# Patient Record
Sex: Female | Born: 1950 | Race: White | Hispanic: No | Marital: Married | Smoking: Never smoker
Health system: Southern US, Community
[De-identification: ages and names within clinical notes are randomized; demographics above are authoritative.]

## PROBLEM LIST (undated history)

## (undated) DIAGNOSIS — G479 Sleep disorder, unspecified: Secondary | ICD-10-CM

## (undated) DIAGNOSIS — E785 Hyperlipidemia, unspecified: Secondary | ICD-10-CM

## (undated) DIAGNOSIS — M1990 Unspecified osteoarthritis, unspecified site: Secondary | ICD-10-CM

## (undated) DIAGNOSIS — K219 Gastro-esophageal reflux disease without esophagitis: Secondary | ICD-10-CM

## (undated) DIAGNOSIS — C801 Malignant (primary) neoplasm, unspecified: Secondary | ICD-10-CM

## (undated) DIAGNOSIS — Z9889 Other specified postprocedural states: Secondary | ICD-10-CM

## (undated) DIAGNOSIS — Z9289 Personal history of other medical treatment: Secondary | ICD-10-CM

## (undated) DIAGNOSIS — R112 Nausea with vomiting, unspecified: Secondary | ICD-10-CM

## (undated) DIAGNOSIS — E039 Hypothyroidism, unspecified: Secondary | ICD-10-CM

## (undated) DIAGNOSIS — I890 Lymphedema, not elsewhere classified: Secondary | ICD-10-CM

## (undated) DIAGNOSIS — Z8489 Family history of other specified conditions: Secondary | ICD-10-CM

## (undated) DIAGNOSIS — L259 Unspecified contact dermatitis, unspecified cause: Secondary | ICD-10-CM

## (undated) DIAGNOSIS — R51 Headache: Secondary | ICD-10-CM

## (undated) DIAGNOSIS — I10 Essential (primary) hypertension: Secondary | ICD-10-CM

## (undated) DIAGNOSIS — R519 Headache, unspecified: Secondary | ICD-10-CM

## (undated) DIAGNOSIS — F419 Anxiety disorder, unspecified: Secondary | ICD-10-CM

## (undated) HISTORY — PX: CATARACT EXTRACTION, BILATERAL: SHX1313

## (undated) HISTORY — PX: EYE SURGERY: SHX253

## (undated) HISTORY — PX: TONSILLECTOMY: SUR1361

## (undated) HISTORY — PX: TUBAL LIGATION: SHX77

## (undated) HISTORY — PX: BILATERAL TOTAL MASTECTOMY WITH AXILLARY LYMPH NODE DISSECTION: SHX6364

## (undated) HISTORY — PX: OTHER SURGICAL HISTORY: SHX169

## (undated) HISTORY — PX: ABDOMINAL HYSTERECTOMY: SHX81

## (undated) HISTORY — PX: APPENDECTOMY: SHX54

---

## 1979-01-10 DIAGNOSIS — Z9289 Personal history of other medical treatment: Secondary | ICD-10-CM

## 1979-01-10 HISTORY — DX: Personal history of other medical treatment: Z92.89

## 2013-10-22 NOTE — H&P (Signed)
TOTAL KNEE ADMISSION H&P  Patient is being admitted for right total knee arthroplasty.  Subjective:  Chief Complaint:    Right knee OA / pain  HPI: Christina Crawford, 63 y.o. female, has a history of pain and functional disability in the right knee due to arthritis and has failed non-surgical conservative treatments for greater than 12 weeks to include NSAID's and/or analgesics, corticosteriod injections and activity modification.  Onset of symptoms was gradual, starting >10 years ago with gradually worsening course since that time. The patient noted prior procedures on the knee to include  arthroscopy on the right knee(s).  Patient currently rates pain in the right knee(s) at 10 out of 10 with activity. Patient has night pain, worsening of pain with activity and weight bearing, pain that interferes with activities of daily living, pain with passive range of motion, crepitus and joint swelling.  Patient has evidence of periarticular osteophytes and joint space narrowing by imaging studies. There is no active infection.  Risks, benefits and expectations were discussed with the patient.  Risks including but not limited to the risk of anesthesia, blood clots, nerve damage, blood vessel damage, failure of the prosthesis, infection and up to and including death.  Patient understand the risks, benefits and expectations and wishes to proceed with surgery.   PCP: No primary provider on file.  D/C Plans:      Home with HHPT  Post-op Meds:       No Rx given  Tranexamic Acid:      To be given - IV    Decadron:      Is to be given  FYI:     ASA post-op  Norco post-op     Past Medical History  Diagnosis Date  . Lymphedema     left arm   . PONV (postoperative nausea and vomiting)   . Family history of anesthesia complication     sister slow to wake up with general anesthesia   . Hypertension   . Hypothyroidism   . GERD (gastroesophageal reflux disease)   . Headache     not often   . Arthritis    . Cancer     left breast cancer - 2011     Past Surgical History  Procedure Laterality Date  . Bilateral total mastectomy with axillary lymph node dissection    . Abdominal hysterectomy    . Appendectomy    . Ganglion tumors removed from hands     . Tubal ligation    . Right knee arthroscopy         Allergies  Allergen Reactions  . Sulfamethoxazole Nausea Only  . Adhesive [Tape] Rash  . Augmentin Es-600 [Amoxicillin-Pot Clavulanate] Rash  . Contrast Media [Iodinated Diagnostic Agents] Rash  . Latex Rash  . Suprax [Cefixime] Rash      History  Substance Use Topics  . Smoking status: Never Smoker   . Smokeless tobacco: Never Used  . Alcohol Use: No       Review of Systems  Constitutional: Positive for malaise/fatigue.  HENT: Negative.   Eyes: Negative.   Respiratory: Negative.   Cardiovascular: Negative.   Gastrointestinal: Positive for heartburn and nausea.  Genitourinary: Negative.   Musculoskeletal: Positive for joint pain.  Skin: Negative.   Neurological: Negative.   Endo/Heme/Allergies: Negative.   Psychiatric/Behavioral: The patient has insomnia.     Objective:  Physical Exam  Constitutional: She is oriented to person, place, and time. She appears well-developed and well-nourished.  HENT:  Head: Normocephalic and atraumatic.  Eyes: Pupils are equal, round, and reactive to light.  Neck: Neck supple. No JVD present. No tracheal deviation present. No thyromegaly present.  Cardiovascular: Normal rate, regular rhythm, normal heart sounds and intact distal pulses.   Respiratory: Effort normal and breath sounds normal. No respiratory distress. She has no wheezes.  GI: Soft. There is no tenderness. There is no guarding.  Musculoskeletal:       Right knee: She exhibits decreased range of motion, swelling, abnormal alignment (valgus) and bony tenderness. She exhibits no ecchymosis, no deformity, no laceration and no erythema. Tenderness found.   Lymphadenopathy:    She has no cervical adenopathy.  Neurological: She is alert and oriented to person, place, and time.  Skin: Skin is warm and dry.  Psychiatric: She has a normal mood and affect.      Imaging Review Plain radiographs demonstrate severe degenerative joint disease of the right knee(s). The overall alignment is mild valgus. The bone quality appears to be good for age and reported activity level.  Assessment/Plan:  End stage arthritis, right knee   The patient history, physical examination, clinical judgment of the provider and imaging studies are consistent with end stage degenerative joint disease of the right knee(s) and total knee arthroplasty is deemed medically necessary. The treatment options including medical management, injection therapy arthroscopy and arthroplasty were discussed at length. The risks and benefits of total knee arthroplasty were presented and reviewed. The risks due to aseptic loosening, infection, stiffness, patella tracking problems, thromboembolic complications and other imponderables were discussed. The patient acknowledged the explanation, agreed to proceed with the plan and consent was signed. Patient is being admitted for inpatient treatment for surgery, pain control, PT, OT, prophylactic antibiotics, VTE prophylaxis, progressive ambulation and ADL's and discharge planning. The patient is planning to be discharged home with home health services.      West Pugh Adolf [place]   PA-C  10/22/2013, 3:07 PM

## 2013-10-23 ENCOUNTER — Encounter (HOSPITAL_COMMUNITY): Payer: Self-pay | Admitting: Pharmacy Technician

## 2013-10-29 NOTE — Patient Instructions (Addendum)
KAYDANCE BOWIE  10/29/2013   Your procedure is scheduled on:  11/11/2013    Come thru the Emergency Room entrance and   Follow the Signs to [facility] at  [place]      am  Call this number if you have problems the morning of surgery: [phone]   Remember:   Do not eat food or drink liquids after midnight.   Take these medicines the morning of surgery with A SIP OF WATER: hydrocodone if needed, synthroid, prilosec    Do not wear jewelry, make-up or nail polish.  Do not wear lotions, powders, or perfumes. deodorant.  Do not shave 48 hours prior to surgery.   Do not bring valuables to the hospital.  Contacts, dentures or bridgework may not be worn into surgery.  Leave suitcase in the car. After surgery it may be brought to your room.  For patients admitted to the hospital, checkout time is 11:00 AM the day of  discharge.        Please read over the following fact sheets that you were given: MRSA Information, coughing and deep breathing exercises, leg exercises            [facility] - Preparing for Surgery Before surgery, you can play an important role.  Because skin is not sterile, your skin needs to be as free of germs as possible.  You can reduce the number of germs on your skin by washing with CHG (chlorahexidine gluconate) soap before surgery.  CHG is an antiseptic cleaner which kills germs and bonds with the skin to continue killing germs even after washing. Please DO NOT use if you have an allergy to CHG or antibacterial soaps.  If your skin becomes reddened/irritated stop using the CHG and inform your nurse when you arrive at Short Stay. Do not shave (including legs and underarms) for at least 48 hours prior to the first CHG shower.  You may shave your face/neck. Please follow these instructions carefully:  1.  Shower with CHG Soap the night before surgery and the  morning of Surgery.  2.  If you choose to wash your hair, wash your hair first as usual with your  normal   shampoo.  3.  After you shampoo, rinse your hair and body thoroughly to remove the  shampoo.                           4.  Use CHG as you would any other liquid soap.  You can apply chg directly  to the skin and wash                       Gently with a scrungie or clean washcloth.  5.  Apply the CHG Soap to your body ONLY FROM THE NECK DOWN.   Do not use on face/ open                           Wound or open sores. Avoid contact with eyes, ears mouth and genitals (private parts).                       Wash face,  Genitals (private parts) with your normal soap.             6.  Wash thoroughly, paying special attention to the area where your surgery  will be  performed.  7.  Thoroughly rinse your body with warm water from the neck down.  8.  DO NOT shower/wash with your normal soap after using and rinsing off  the CHG Soap.                9.  Pat yourself dry with a clean towel.            10.  Wear clean pajamas.            11.  Place clean sheets on your bed the night of your first shower and do not  sleep with pets. Day of Surgery : Do not apply any lotions/deodorants the morning of surgery.  Please wear clean clothes to the hospital/surgery center.  FAILURE TO FOLLOW THESE INSTRUCTIONS MAY RESULT IN THE CANCELLATION OF YOUR SURGERY PATIENT SIGNATURE_________________________________  NURSE SIGNATURE__________________________________  ________________________________________________________________________  WHAT IS A BLOOD TRANSFUSION? Blood Transfusion Information  A transfusion is the replacement of blood or some of its parts. Blood is made up of multiple cells which provide different functions.  Red blood cells carry oxygen and are used for blood loss replacement.  White blood cells fight against infection.  Platelets control bleeding.  Plasma helps clot blood.  Other blood products are available for specialized needs, such as hemophilia or other clotting disorders. BEFORE THE  TRANSFUSION  Who gives blood for transfusions?   Healthy volunteers who are fully evaluated to make sure their blood is safe. This is blood bank blood. Transfusion therapy is the safest it has ever been in the practice of medicine. Before blood is taken from a donor, a complete history is taken to make sure that person has no history of diseases nor engages in risky social behavior (examples are intravenous drug use or sexual activity with multiple partners). The donor's travel history is screened to minimize risk of transmitting infections, such as malaria. The donated blood is tested for signs of infectious diseases, such as HIV and hepatitis. The blood is then tested to be sure it is compatible with you in order to minimize the chance of a transfusion reaction. If you or a relative donates blood, this is often done in anticipation of surgery and is not appropriate for emergency situations. It takes many days to process the donated blood. RISKS AND COMPLICATIONS Although transfusion therapy is very safe and saves many lives, the main dangers of transfusion include:   Getting an infectious disease.  Developing a transfusion reaction. This is an allergic reaction to something in the blood you were given. Every precaution is taken to prevent this. The decision to have a blood transfusion has been considered carefully by your caregiver before blood is given. Blood is not given unless the benefits outweigh the risks. AFTER THE TRANSFUSION  Right after receiving a blood transfusion, you will usually feel much better and more energetic. This is especially true if your red blood cells have gotten low (anemic). The transfusion raises the level of the red blood cells which carry oxygen, and this usually causes an energy increase.  The nurse administering the transfusion will monitor you carefully for complications. HOME CARE INSTRUCTIONS  No special instructions are needed after a transfusion. You may find  your energy is better. Speak with your caregiver about any limitations on activity for underlying diseases you may have. SEEK MEDICAL CARE IF:   Your condition is not improving after your transfusion.  You develop redness or irritation at the intravenous (IV) site. [place]  IF:  Any of the following symptoms occur over the next 12 hours:  Shaking chills.  You have a temperature by mouth above 102 F (38.9 C), not controlled by medicine.  Chest, back, or muscle pain.  People around you feel you are not acting correctly or are confused.  Shortness of breath or difficulty breathing.  Dizziness and fainting.  You get a rash or develop hives.  You have a decrease in urine output.  Your urine turns a dark color or changes to pink, red, or brown. Any of the following symptoms occur over the next 10 days:  You have a temperature by mouth above 102 F (38.9 C), not controlled by medicine.  Shortness of breath.  Weakness after normal activity.  The white part of the eye turns yellow (jaundice).  You have a decrease in the amount of urine or are urinating less often.  Your urine turns a dark color or changes to pink, red, or brown. Document Released: 12/24/1999 Document Revised: 03/20/2011 Document Reviewed: 08/12/2007 Exit[employer] Patient Information 2014 [place].  _______________________________________________________________________  Incentive Spirometer  An incentive spirometer is a tool that can help keep your lungs clear and active. This tool measures how well you are filling your lungs with each breath. Taking long deep breaths may help reverse or decrease the chance of developing breathing (pulmonary) problems (especially infection) following:  A long period of time when you are unable to move or be active. BEFORE THE PROCEDURE   If the spirometer includes an indicator to show your best effort, your nurse or respiratory therapist will set it  to a desired goal.  If possible, sit up straight or lean slightly forward. Try not to slouch.  Hold the incentive spirometer in an upright position. INSTRUCTIONS FOR USE  1. Sit on the edge of your bed if possible, or sit up as far as you can in bed or on a chair. 2. Hold the incentive spirometer in an upright position. 3. Breathe out normally. 4. Place the mouthpiece in your mouth and seal your lips tightly around it. 5. Breathe in slowly and as deeply as possible, raising the piston or the ball toward the top of the column. 6. Hold your breath for 3-5 seconds or for as long as possible. Allow the piston or ball to fall to the bottom of the column. 7. Remove the mouthpiece from your mouth and breathe out normally. 8. Rest for a few seconds and repeat Steps 1 through 7 at least 10 times every 1-2 hours when you are awake. Take your time and take a few normal breaths between deep breaths. 9. The spirometer may include an indicator to show your best effort. Use the indicator as a goal to work toward during each repetition. 10. After each set of 10 deep breaths, practice coughing to be sure your lungs are clear. If you have an incision (the cut made at the time of surgery), support your incision when coughing by placing a pillow or rolled up towels firmly against it. Once you are able to get out of bed, walk around indoors and cough well. You may stop using the incentive spirometer when instructed by your caregiver.  RISKS AND COMPLICATIONS  Take your time so you do not get dizzy or light-headed.  If you are in pain, you may need to take or ask for pain medication before doing incentive spirometry. It is harder to take a deep breath if you are having pain. AFTER USE  Rest  and breathe slowly and easily.  It can be helpful to keep track of a log of your progress. Your caregiver can provide you with a simple table to help with this. If you are using the spirometer at home, follow these  instructions: [place] IF:   You are having difficultly using the spirometer.  You have trouble using the spirometer as often as instructed.  Your pain medication is not giving enough relief while using the spirometer.  You develop fever of 100.5 F (38.1 C) or higher. SEEK IMMEDIATE MEDICAL CARE IF:   You cough up bloody sputum that had not been present before.  You develop fever of 102 F (38.9 C) or greater.  You develop worsening pain at or near the incision site. MAKE SURE YOU:   Understand these instructions.  Will watch your condition.  Will get help right away if you are not doing well or get worse. Document Released: 05/08/2006 Document Revised: 03/20/2011 Document Reviewed: 07/09/2006 [facility] Patient Information 2014 [place], [place].   ________________________________________________________________________

## 2013-10-30 ENCOUNTER — Encounter (HOSPITAL_COMMUNITY): Payer: Self-pay

## 2013-10-30 ENCOUNTER — Encounter (HOSPITAL_COMMUNITY)
Admission: RE | Admit: 2013-10-30 | Discharge: 2013-10-30 | Disposition: A | Discharge status: Home / Self Care | Payer: Medicare Other | Source: Ambulatory Visit | Attending: Orthopedic Surgery | Admitting: Orthopedic Surgery

## 2013-10-30 ENCOUNTER — Ambulatory Visit (HOSPITAL_COMMUNITY)
Admission: RE | Admit: 2013-10-30 | Discharge: 2013-10-30 | Disposition: A | Discharge status: Home / Self Care | Payer: Medicare Other | Source: Ambulatory Visit | Attending: Orthopedic Surgery | Admitting: Orthopedic Surgery

## 2013-10-30 DIAGNOSIS — I10 Essential (primary) hypertension: Secondary | ICD-10-CM

## 2013-10-30 DIAGNOSIS — Z853 Personal history of malignant neoplasm of breast: Secondary | ICD-10-CM | POA: Insufficient documentation

## 2013-10-30 DIAGNOSIS — Z01810 Encounter for preprocedural cardiovascular examination: Secondary | ICD-10-CM | POA: Diagnosis present

## 2013-10-30 HISTORY — DX: Family history of other specified conditions: Z84.89

## 2013-10-30 HISTORY — DX: Malignant (primary) neoplasm, unspecified: C80.1

## 2013-10-30 HISTORY — DX: Headache, unspecified: R51.9

## 2013-10-30 HISTORY — DX: Unspecified osteoarthritis, unspecified site: M19.90

## 2013-10-30 HISTORY — DX: Other specified postprocedural states: R11.2

## 2013-10-30 HISTORY — DX: Lymphedema, not elsewhere classified: I89.0

## 2013-10-30 HISTORY — DX: Headache: R51

## 2013-10-30 HISTORY — DX: Gastro-esophageal reflux disease without esophagitis: K21.9

## 2013-10-30 HISTORY — DX: Hypothyroidism, unspecified: E03.9

## 2013-10-30 HISTORY — DX: Essential (primary) hypertension: I10

## 2013-10-30 HISTORY — DX: Other specified postprocedural states: Z98.890

## 2013-10-30 LAB — APTT: APTT: 28 s (ref 24–37)

## 2013-10-30 LAB — URINALYSIS, ROUTINE W REFLEX MICROSCOPIC
Bilirubin Urine: NEGATIVE
Glucose, UA: NEGATIVE mg/dL
HGB URINE DIPSTICK: NEGATIVE
Ketones, ur: NEGATIVE mg/dL
Leukocytes, UA: NEGATIVE
Nitrite: NEGATIVE
PH: 6 (ref 5.0–8.0)
PROTEIN: NEGATIVE mg/dL
Specific Gravity, Urine: 1.012 (ref 1.005–1.030)
Urobilinogen, UA: 0.2 mg/dL (ref 0.0–1.0)

## 2013-10-30 LAB — PROTIME-INR
INR: 0.99 (ref 0.00–1.49)
Prothrombin Time: 13.2 seconds (ref 11.6–15.2)

## 2013-10-30 LAB — SURGICAL PCR SCREEN
MRSA, PCR: INVALID — AB
Staphylococcus aureus: INVALID — AB

## 2013-10-30 NOTE — Progress Notes (Signed)
Clearance - Dr Redmond Pulling- 10/16/2013 on chart LOV with Dr Redmond Pulling 10/20/2013 on chart  EKG- 10/16/2013 on chart  Labs on chart of CBC/DIFF/CMP on chart done 10/20/2013.

## 2013-10-30 NOTE — Progress Notes (Signed)
At preop appoinrtment for upcoming surgery on 11/11/2013 patient scored a 4 on STOP BANG Assessment Tool for Obstructive Sleep Apnea.  This patient is at elevated risk for Obstructive Sleep Apnea.

## 2013-11-02 LAB — MRSA CULTURE

## 2013-11-10 NOTE — Anesthesia Preprocedure Evaluation (Addendum)
Anesthesia Evaluation  Patient identified by MRN, date of birth, ID band Patient awake    Reviewed: Allergy & Precautions, H&P , NPO status , Patient's Chart, lab work & pertinent test results  History of Anesthesia Complications (+) PONV, Family history of anesthesia reaction and history of anesthetic complications (sister slow to emerge from anesthesia)  Airway Mallampati: III  TM Distance: >3 FB Neck ROM: Full    Dental no notable dental hx. (+) Teeth Intact, Dental Advisory Given   Pulmonary neg pulmonary ROS,  breath sounds clear to auscultation  Pulmonary exam normal       Cardiovascular Exercise Tolerance: Good hypertension, Pt. on medications and Pt. on home beta blockers Rhythm:Regular Rate:Normal     Neuro/Psych  Headaches, negative psych ROS   GI/Hepatic Neg liver ROS, GERD-  Medicated and Controlled,  Endo/Other  Hypothyroidism   Renal/GU negative Renal ROS  negative genitourinary   Musculoskeletal  (+) Arthritis -,   Abdominal   Peds negative pediatric ROS (+)  Hematology negative hematology ROS (+)   Anesthesia Other Findings Hx of breast ca- L  Reproductive/Obstetrics negative OB ROS                            Anesthesia Physical Anesthesia Plan  ASA: II  Anesthesia Plan: Spinal   Post-op Pain Management:    Induction: Intravenous  Airway Management Planned: Nasal Cannula  Additional Equipment:   Intra-op Plan:   Post-operative Plan: Extubation in OR  Informed Consent: I have reviewed the patients History and Physical, chart, labs and discussed the procedure including the risks, benefits and alternatives for the proposed anesthesia with the patient or authorized representative who has indicated his/her understanding and acceptance.   Dental advisory given  Plan Discussed with: CRNA  Anesthesia Plan Comments:         Anesthesia Quick Evaluation

## 2013-11-11 ENCOUNTER — Encounter (HOSPITAL_COMMUNITY): Payer: Self-pay | Admitting: *Deleted

## 2013-11-11 ENCOUNTER — Inpatient Hospital Stay (HOSPITAL_COMMUNITY)
Admission: RE | Admit: 2013-11-11 | Discharge: 2013-11-13 | DRG: 470 | Disposition: A | Discharge status: Home Health | Payer: Medicare Other | Source: Ambulatory Visit | Attending: Orthopedic Surgery | Admitting: Orthopedic Surgery

## 2013-11-11 ENCOUNTER — Encounter (HOSPITAL_COMMUNITY)
Admission: RE | Disposition: A | Discharge status: Home Health | Payer: Self-pay | Source: Ambulatory Visit | Attending: Orthopedic Surgery

## 2013-11-11 ENCOUNTER — Inpatient Hospital Stay (HOSPITAL_COMMUNITY): Payer: Medicare Other | Admitting: Anesthesiology

## 2013-11-11 DIAGNOSIS — M659 Synovitis and tenosynovitis, unspecified: Secondary | ICD-10-CM | POA: Diagnosis present

## 2013-11-11 DIAGNOSIS — Z853 Personal history of malignant neoplasm of breast: Secondary | ICD-10-CM

## 2013-11-11 DIAGNOSIS — M179 Osteoarthritis of knee, unspecified: Secondary | ICD-10-CM | POA: Diagnosis present

## 2013-11-11 DIAGNOSIS — E039 Hypothyroidism, unspecified: Secondary | ICD-10-CM | POA: Diagnosis present

## 2013-11-11 DIAGNOSIS — I10 Essential (primary) hypertension: Secondary | ICD-10-CM | POA: Diagnosis present

## 2013-11-11 DIAGNOSIS — Z6836 Body mass index (BMI) 36.0-36.9, adult: Secondary | ICD-10-CM | POA: Diagnosis not present

## 2013-11-11 DIAGNOSIS — E669 Obesity, unspecified: Secondary | ICD-10-CM | POA: Diagnosis present

## 2013-11-11 DIAGNOSIS — Z96659 Presence of unspecified artificial knee joint: Secondary | ICD-10-CM

## 2013-11-11 DIAGNOSIS — M25561 Pain in right knee: Secondary | ICD-10-CM | POA: Diagnosis present

## 2013-11-11 DIAGNOSIS — K219 Gastro-esophageal reflux disease without esophagitis: Secondary | ICD-10-CM | POA: Diagnosis present

## 2013-11-11 DIAGNOSIS — Z96651 Presence of right artificial knee joint: Secondary | ICD-10-CM

## 2013-11-11 HISTORY — PX: TOTAL KNEE ARTHROPLASTY: SHX125

## 2013-11-11 LAB — ABO/RH: ABO/RH(D): A POS

## 2013-11-11 LAB — TYPE AND SCREEN
ABO/RH(D): A POS
ANTIBODY SCREEN: NEGATIVE

## 2013-11-11 SURGERY — ARTHROPLASTY, KNEE, TOTAL
Anesthesia: Spinal | Site: Knee | Laterality: Right

## 2013-11-11 MED ORDER — PROPOFOL INFUSION 10 MG/ML OPTIME
INTRAVENOUS | Status: DC | PRN
  Administered 2013-11-11: 50 ug/kg/min via INTRAVENOUS

## 2013-11-11 MED ORDER — CEFAZOLIN SODIUM-DEXTROSE 2-3 GM-% IV SOLR: 2.0000 g | INTRAVENOUS | Status: DC

## 2013-11-11 MED ORDER — ROSUVASTATIN CALCIUM 10 MG PO TABS
10.0000 mg | ORAL_TABLET | Freq: Every day | ORAL | Status: DC
  Administered 2013-11-11 – 2013-11-12 (×2): 10 mg via ORAL
  Filled 2013-11-11 (×3): qty 1

## 2013-11-11 MED ORDER — MENTHOL 3 MG MT LOZG: 1.0000 | LOZENGE | OROMUCOSAL | Status: DC | PRN

## 2013-11-11 MED ORDER — FOLIC ACID 1 MG PO TABS
1.0000 mg | ORAL_TABLET | Freq: Every day | ORAL | Status: DC
  Administered 2013-11-11 – 2013-11-12 (×2): 1 mg via ORAL
  Filled 2013-11-11 (×3): qty 1

## 2013-11-11 MED ORDER — ONDANSETRON HCL 4 MG/2ML IJ SOLN: 4.0000 mg | Freq: Once | INTRAMUSCULAR | Status: DC | PRN

## 2013-11-11 MED ORDER — HYDROCHLOROTHIAZIDE 25 MG PO TABS
25.0000 mg | ORAL_TABLET | Freq: Every day | ORAL | Status: DC
  Administered 2013-11-11 – 2013-11-12 (×2): 25 mg via ORAL
  Filled 2013-11-11 (×3): qty 1

## 2013-11-11 MED ORDER — ONDANSETRON HCL 4 MG PO TABS
4.0000 mg | ORAL_TABLET | Freq: Four times a day (QID) | ORAL | Status: DC | PRN
  Administered 2013-11-13: 4 mg via ORAL
  Filled 2013-11-11: qty 1

## 2013-11-11 MED ORDER — FENTANYL CITRATE 0.05 MG/ML IJ SOLN
INTRAMUSCULAR | Status: AC
  Filled 2013-11-11: qty 2

## 2013-11-11 MED ORDER — BUPIVACAINE-EPINEPHRINE (PF) 0.25% -1:200000 IJ SOLN
INTRAMUSCULAR | Status: DC | PRN
  Administered 2013-11-11: 30 mL

## 2013-11-11 MED ORDER — LETROZOLE 2.5 MG PO TABS
2.5000 mg | ORAL_TABLET | Freq: Every day | ORAL | Status: DC
  Administered 2013-11-11 – 2013-11-12 (×2): 2.5 mg via ORAL
  Filled 2013-11-11 (×3): qty 1

## 2013-11-11 MED ORDER — SODIUM CHLORIDE 0.9 % IJ SOLN
INTRAMUSCULAR | Status: AC
  Filled 2013-11-11: qty 50

## 2013-11-11 MED ORDER — TRANEXAMIC ACID 100 MG/ML IV SOLN
1000.0000 mg | Freq: Once | INTRAVENOUS | Status: AC
  Administered 2013-11-11: 1000 mg via INTRAVENOUS
  Filled 2013-11-11: qty 10

## 2013-11-11 MED ORDER — CEFAZOLIN SODIUM-DEXTROSE 2-3 GM-% IV SOLR
INTRAVENOUS | Status: AC
  Filled 2013-11-11: qty 50

## 2013-11-11 MED ORDER — METHOCARBAMOL 1000 MG/10ML IJ SOLN
500.0000 mg | Freq: Four times a day (QID) | INTRAVENOUS | Status: DC | PRN
  Administered 2013-11-11 (×2): 500 mg via INTRAVENOUS
  Filled 2013-11-11 (×3): qty 5

## 2013-11-11 MED ORDER — LACTATED RINGERS IV SOLN: INTRAVENOUS | Status: DC

## 2013-11-11 MED ORDER — FENTANYL CITRATE 0.05 MG/ML IJ SOLN
INTRAMUSCULAR | Status: DC | PRN
  Administered 2013-11-11 (×4): 25 ug via INTRAVENOUS

## 2013-11-11 MED ORDER — ONDANSETRON HCL 4 MG/2ML IJ SOLN
INTRAMUSCULAR | Status: AC
  Filled 2013-11-11: qty 2

## 2013-11-11 MED ORDER — HYDROCODONE-ACETAMINOPHEN 7.5-325 MG PO TABS
1.0000 | ORAL_TABLET | ORAL | Status: DC
  Administered 2013-11-11: 1 via ORAL
  Administered 2013-11-11: 2 via ORAL
  Administered 2013-11-11: 1 via ORAL
  Administered 2013-11-11 – 2013-11-12 (×3): 2 via ORAL
  Administered 2013-11-12: 1 via ORAL
  Administered 2013-11-12 – 2013-11-13 (×5): 2 via ORAL
  Filled 2013-11-11 (×11): qty 2

## 2013-11-11 MED ORDER — FERROUS SULFATE 325 (65 FE) MG PO TABS
325.0000 mg | ORAL_TABLET | Freq: Three times a day (TID) | ORAL | Status: DC
  Administered 2013-11-11 – 2013-11-13 (×6): 325 mg via ORAL
  Filled 2013-11-11 (×9): qty 1

## 2013-11-11 MED ORDER — HYDROMORPHONE HCL 1 MG/ML IJ SOLN
0.5000 mg | INTRAMUSCULAR | Status: DC | PRN
  Administered 2013-11-11 – 2013-11-12 (×4): 1 mg via INTRAVENOUS
  Filled 2013-11-11 (×4): qty 1

## 2013-11-11 MED ORDER — PROPOFOL 10 MG/ML IV BOLUS
INTRAVENOUS | Status: DC | PRN
  Administered 2013-11-11: 20 mg via INTRAVENOUS

## 2013-11-11 MED ORDER — CHLORHEXIDINE GLUCONATE 4 % EX LIQD: 60.0000 mL | Freq: Once | CUTANEOUS | Status: DC

## 2013-11-11 MED ORDER — SODIUM CHLORIDE 0.9 % IJ SOLN
INTRAMUSCULAR | Status: DC | PRN
  Administered 2013-11-11: 30 mL

## 2013-11-11 MED ORDER — BUPIVACAINE IN DEXTROSE 0.75-8.25 % IT SOLN
INTRATHECAL | Status: DC | PRN
  Administered 2013-11-11: 1.8 mL via INTRATHECAL

## 2013-11-11 MED ORDER — ASPIRIN EC 325 MG PO TBEC
325.0000 mg | DELAYED_RELEASE_TABLET | Freq: Two times a day (BID) | ORAL | Status: DC
  Administered 2013-11-12 – 2013-11-13 (×3): 325 mg via ORAL
  Filled 2013-11-11 (×5): qty 1

## 2013-11-11 MED ORDER — MIDAZOLAM HCL 2 MG/2ML IJ SOLN
INTRAMUSCULAR | Status: AC
  Filled 2013-11-11: qty 2

## 2013-11-11 MED ORDER — LEVOTHYROXINE SODIUM 50 MCG PO TABS
50.0000 ug | ORAL_TABLET | Freq: Every day | ORAL | Status: DC
  Administered 2013-11-12 – 2013-11-13 (×2): 50 ug via ORAL
  Filled 2013-11-11 (×3): qty 1

## 2013-11-11 MED ORDER — PROPOFOL 10 MG/ML IV BOLUS
INTRAVENOUS | Status: AC
  Filled 2013-11-11: qty 20

## 2013-11-11 MED ORDER — ALUM & MAG HYDROXIDE-SIMETH 200-200-20 MG/5ML PO SUSP: 30.0000 mL | ORAL | Status: DC | PRN

## 2013-11-11 MED ORDER — CLINDAMYCIN PHOSPHATE 900 MG/50ML IV SOLN
INTRAVENOUS | Status: DC | PRN
  Administered 2013-11-11: 900 mg via INTRAVENOUS

## 2013-11-11 MED ORDER — DEXAMETHASONE SODIUM PHOSPHATE 10 MG/ML IJ SOLN
10.0000 mg | Freq: Once | INTRAMUSCULAR | Status: AC
  Administered 2013-11-11: 10 mg via INTRAVENOUS

## 2013-11-11 MED ORDER — KETOROLAC TROMETHAMINE 30 MG/ML IJ SOLN
INTRAMUSCULAR | Status: DC | PRN
  Administered 2013-11-11: 30 mg

## 2013-11-11 MED ORDER — METOPROLOL SUCCINATE ER 100 MG PO TB24
100.0000 mg | ORAL_TABLET | Freq: Every day | ORAL | Status: DC
  Administered 2013-11-11 – 2013-11-12 (×2): 100 mg via ORAL
  Filled 2013-11-11 (×3): qty 1

## 2013-11-11 MED ORDER — POLYETHYLENE GLYCOL 3350 17 G PO PACK
17.0000 g | PACK | Freq: Two times a day (BID) | ORAL | Status: DC
  Administered 2013-11-11 – 2013-11-12 (×3): 17 g via ORAL

## 2013-11-11 MED ORDER — METHOCARBAMOL 500 MG PO TABS
500.0000 mg | ORAL_TABLET | Freq: Four times a day (QID) | ORAL | Status: DC | PRN
  Administered 2013-11-12 – 2013-11-13 (×2): 500 mg via ORAL
  Filled 2013-11-11 (×2): qty 1

## 2013-11-11 MED ORDER — CLINDAMYCIN PHOSPHATE 600 MG/50ML IV SOLN
600.0000 mg | Freq: Four times a day (QID) | INTRAVENOUS | Status: AC
  Administered 2013-11-11 (×2): 600 mg via INTRAVENOUS
  Filled 2013-11-11 (×2): qty 50

## 2013-11-11 MED ORDER — FENTANYL CITRATE 0.05 MG/ML IJ SOLN
25.0000 ug | INTRAMUSCULAR | Status: DC | PRN
  Administered 2013-11-11 (×2): 25 ug via INTRAVENOUS

## 2013-11-11 MED ORDER — EPHEDRINE SULFATE 50 MG/ML IJ SOLN
INTRAMUSCULAR | Status: AC
  Filled 2013-11-11: qty 1

## 2013-11-11 MED ORDER — SODIUM CHLORIDE 0.9 % IV SOLN
INTRAVENOUS | Status: DC
  Administered 2013-11-11 – 2013-11-12 (×2): via INTRAVENOUS
  Filled 2013-11-11 (×7): qty 1000

## 2013-11-11 MED ORDER — BISACODYL 10 MG RE SUPP: 10.0000 mg | Freq: Every day | RECTAL | Status: DC | PRN

## 2013-11-11 MED ORDER — BUPIVACAINE-EPINEPHRINE (PF) 0.25% -1:200000 IJ SOLN
INTRAMUSCULAR | Status: AC
  Filled 2013-11-11: qty 30

## 2013-11-11 MED ORDER — DEXAMETHASONE SODIUM PHOSPHATE 10 MG/ML IJ SOLN
INTRAMUSCULAR | Status: AC
  Filled 2013-11-11: qty 1

## 2013-11-11 MED ORDER — PANTOPRAZOLE SODIUM 40 MG PO TBEC
40.0000 mg | DELAYED_RELEASE_TABLET | Freq: Every day | ORAL | Status: DC
Start: 2013-11-12 — End: 2013-11-13
  Administered 2013-11-12 – 2013-11-13 (×2): 40 mg via ORAL
  Filled 2013-11-11 (×3): qty 1

## 2013-11-11 MED ORDER — LORATADINE 10 MG PO TABS
10.0000 mg | ORAL_TABLET | Freq: Every day | ORAL | Status: DC
  Administered 2013-11-11 – 2013-11-13 (×3): 10 mg via ORAL
  Filled 2013-11-11 (×3): qty 1

## 2013-11-11 MED ORDER — DEXAMETHASONE SODIUM PHOSPHATE 10 MG/ML IJ SOLN
10.0000 mg | Freq: Once | INTRAMUSCULAR | Status: AC
  Administered 2013-11-12: 10 mg via INTRAVENOUS
  Filled 2013-11-11: qty 1

## 2013-11-11 MED ORDER — 0.9 % SODIUM CHLORIDE (POUR BTL) OPTIME
TOPICAL | Status: DC | PRN
  Administered 2013-11-11: 1000 mL

## 2013-11-11 MED ORDER — MAGNESIUM CITRATE PO SOLN: 1.0000 | Freq: Once | ORAL | Status: AC | PRN

## 2013-11-11 MED ORDER — METOCLOPRAMIDE HCL 5 MG/ML IJ SOLN
5.0000 mg | Freq: Three times a day (TID) | INTRAMUSCULAR | Status: DC | PRN
  Administered 2013-11-12: 10 mg via INTRAVENOUS
  Filled 2013-11-11: qty 2

## 2013-11-11 MED ORDER — PHENOL 1.4 % MT LIQD: 1.0000 | OROMUCOSAL | Status: DC | PRN

## 2013-11-11 MED ORDER — LACTATED RINGERS IV SOLN
INTRAVENOUS | Status: DC | PRN
  Administered 2013-11-11 (×3): via INTRAVENOUS

## 2013-11-11 MED ORDER — MIDAZOLAM HCL 5 MG/5ML IJ SOLN
INTRAMUSCULAR | Status: DC | PRN
  Administered 2013-11-11: 2 mg via INTRAVENOUS

## 2013-11-11 MED ORDER — SUMATRIPTAN SUCCINATE 50 MG PO TABS
50.0000 mg | ORAL_TABLET | ORAL | Status: DC | PRN
  Filled 2013-11-11: qty 1

## 2013-11-11 MED ORDER — SODIUM CHLORIDE 0.9 % IJ SOLN
INTRAMUSCULAR | Status: AC
  Filled 2013-11-11: qty 10

## 2013-11-11 MED ORDER — DOCUSATE SODIUM 100 MG PO CAPS
100.0000 mg | ORAL_CAPSULE | Freq: Two times a day (BID) | ORAL | Status: DC
  Administered 2013-11-11 – 2013-11-13 (×5): 100 mg via ORAL

## 2013-11-11 MED ORDER — DIPHENHYDRAMINE HCL 25 MG PO CAPS: 25.0000 mg | ORAL_CAPSULE | Freq: Four times a day (QID) | ORAL | Status: DC | PRN

## 2013-11-11 MED ORDER — SODIUM CHLORIDE 0.9 % IR SOLN
Status: DC | PRN
  Administered 2013-11-11: 1000 mL

## 2013-11-11 MED ORDER — ONDANSETRON HCL 4 MG/2ML IJ SOLN
4.0000 mg | Freq: Four times a day (QID) | INTRAMUSCULAR | Status: DC | PRN
  Administered 2013-11-12 (×2): 4 mg via INTRAVENOUS
  Filled 2013-11-11 (×2): qty 2

## 2013-11-11 MED ORDER — CLINDAMYCIN PHOSPHATE 900 MG/50ML IV SOLN
INTRAVENOUS | Status: AC
  Filled 2013-11-11: qty 50

## 2013-11-11 MED ORDER — ATROPINE SULFATE 0.4 MG/ML IJ SOLN
INTRAMUSCULAR | Status: AC
  Filled 2013-11-11: qty 1

## 2013-11-11 MED ORDER — METOCLOPRAMIDE HCL 10 MG PO TABS: 5.0000 mg | ORAL_TABLET | Freq: Three times a day (TID) | ORAL | Status: DC | PRN

## 2013-11-11 MED ORDER — ONDANSETRON HCL 4 MG/2ML IJ SOLN
INTRAMUSCULAR | Status: DC | PRN
  Administered 2013-11-11: 4 mg via INTRAVENOUS

## 2013-11-11 MED ORDER — KETOROLAC TROMETHAMINE 30 MG/ML IJ SOLN
INTRAMUSCULAR | Status: AC
  Filled 2013-11-11: qty 1

## 2013-11-11 SURGICAL SUPPLY — 54 items
BAG ZIPLOCK 12X15 (MISCELLANEOUS) IMPLANT
BANDAGE ELASTIC 6 VELCRO ST LF (GAUZE/BANDAGES/DRESSINGS) ×3 IMPLANT
BANDAGE ESMARK 6X9 LF (GAUZE/BANDAGES/DRESSINGS) ×1 IMPLANT
BLADE SAW SGTL 13.0X1.19X90.0M (BLADE) ×3 IMPLANT
BNDG ESMARK 6X9 LF (GAUZE/BANDAGES/DRESSINGS) ×3
BOWL SMART MIX CTS (DISPOSABLE) ×3 IMPLANT
CAPT RP KNEE ×3 IMPLANT
CATH FOLEY LATEX FREE 16FR (CATHETERS) ×3 IMPLANT
CEMENT HV SMART SET (Cement) ×6 IMPLANT
CHLORAPREP W/TINT 26ML (MISCELLANEOUS) ×6 IMPLANT
CUFF TOURN SGL QUICK 34 (TOURNIQUET CUFF) ×2
CUFF TRNQT CYL 34X4X40X1 (TOURNIQUET CUFF) ×1 IMPLANT
DECANTER SPIKE VIAL GLASS SM (MISCELLANEOUS) ×3 IMPLANT
DRAPE EXTREMITY TIBURON (DRAPES) ×3 IMPLANT
DRAPE POUCH INSTRU U-SHP 10X18 (DRAPES) ×3 IMPLANT
DRAPE U-SHAPE 47X51 STRL (DRAPES) ×3 IMPLANT
DRSG AQUACEL AG ADV 3.5X10 (GAUZE/BANDAGES/DRESSINGS) ×3 IMPLANT
ELECT REM PT RETURN 9FT ADLT (ELECTROSURGICAL) ×3
ELECTRODE REM PT RTRN 9FT ADLT (ELECTROSURGICAL) ×1 IMPLANT
FACESHIELD WRAPAROUND (MASK) ×12 IMPLANT
GLOVE BIOGEL PI IND STRL 6.5 (GLOVE) ×2 IMPLANT
GLOVE BIOGEL PI IND STRL 7.5 (GLOVE) ×1 IMPLANT
GLOVE BIOGEL PI IND STRL 8.5 (GLOVE) ×1 IMPLANT
GLOVE BIOGEL PI INDICATOR 6.5 (GLOVE) ×4
GLOVE BIOGEL PI INDICATOR 7.5 (GLOVE) ×2
GLOVE BIOGEL PI INDICATOR 8.5 (GLOVE) ×2
GLOVE SURG SS PI 7.0 STRL IVOR (GLOVE) ×6 IMPLANT
GLOVE SURG SS PI 7.5 STRL IVOR (GLOVE) ×6 IMPLANT
GLOVE SURG SS PI 8.5 STRL IVOR (GLOVE) ×4
GLOVE SURG SS PI 8.5 STRL STRW (GLOVE) ×2 IMPLANT
GOWN SPEC L3 XXLG W/TWL (GOWN DISPOSABLE) ×3 IMPLANT
GOWN STRL REUS W/TWL LRG LVL3 (GOWN DISPOSABLE) ×3 IMPLANT
GOWN STRL REUS W/TWL XL LVL3 (GOWN DISPOSABLE) ×6 IMPLANT
HANDPIECE INTERPULSE COAX TIP (DISPOSABLE) ×2
KIT BASIN OR (CUSTOM PROCEDURE TRAY) ×3 IMPLANT
LIQUID BAND (GAUZE/BANDAGES/DRESSINGS) ×3 IMPLANT
MANIFOLD NEPTUNE II (INSTRUMENTS) ×3 IMPLANT
NDL SAFETY ECLIPSE 18X1.5 (NEEDLE) ×1 IMPLANT
NEEDLE HYPO 18GX1.5 SHARP (NEEDLE) ×2
PACK TOTAL JOINT (CUSTOM PROCEDURE TRAY) ×3 IMPLANT
POSITIONER SURGICAL ARM (MISCELLANEOUS) ×3 IMPLANT
SET HNDPC FAN SPRY TIP SCT (DISPOSABLE) ×1 IMPLANT
SET PAD KNEE POSITIONER (MISCELLANEOUS) ×3 IMPLANT
SUCTION FRAZIER 12FR DISP (SUCTIONS) ×3 IMPLANT
SUT MNCRL AB 4-0 PS2 18 (SUTURE) ×3 IMPLANT
SUT VIC AB 1 CT1 36 (SUTURE) ×3 IMPLANT
SUT VIC AB 2-0 CT1 27 (SUTURE) ×6
SUT VIC AB 2-0 CT1 TAPERPNT 27 (SUTURE) ×3 IMPLANT
SUT VLOC 180 0 24IN GS25 (SUTURE) ×3 IMPLANT
SYR 50ML LL SCALE MARK (SYRINGE) ×3 IMPLANT
TOWEL OR 17X26 10 PK STRL BLUE (TOWEL DISPOSABLE) ×3 IMPLANT
TOWEL OR NON WOVEN STRL DISP B (DISPOSABLE) ×3 IMPLANT
WATER STERILE IRR 1500ML POUR (IV SOLUTION) ×3 IMPLANT
WRAP KNEE MAXI GEL POST OP (GAUZE/BANDAGES/DRESSINGS) ×3 IMPLANT

## 2013-11-11 NOTE — Anesthesia Postprocedure Evaluation (Signed)
  Anesthesia Post-op Note  Patient: Christina Crawford  Procedure(s) Performed: Procedure(s) (LRB): RIGHT TOTAL KNEE ARTHROPLASTY (Right)  Patient Location: PACU  Anesthesia Type: Spinal  Level of Consciousness: awake and alert   Airway and Oxygen Therapy: Patient Spontanous Breathing  Post-op Pain: mild  Post-op Assessment: Post-op Vital signs reviewed, Patient's Cardiovascular Status Stable, Respiratory Function Stable, Patent Airway and No signs of Nausea or vomiting  Last Vitals:  Filed Vitals:   11/11/13 1527  BP: 140/64  Pulse: 61  Temp: 36.6 C  Resp: 16    Post-op Vital Signs: stable   Complications: No apparent anesthesia complications

## 2013-11-11 NOTE — Op Note (Signed)
NAME:  Rarden RECORD NO.:  [id number]                             FACILITY:  [facility]      PHYSICIAN:  Pietro Cassis. Alvan Dame, M.D.  DATE OF BIRTH:  December 25, 1950      DATE OF PROCEDURE:  11/11/2013                                     OPERATIVE REPORT         PREOPERATIVE DIAGNOSIS:  Right knee osteoarthritis.      POSTOPERATIVE DIAGNOSIS:  Right knee osteoarthritis.      FINDINGS:  The patient was noted to have complete loss of cartilage and   bone-on-bone arthritis with associated osteophytes in the lateral and patellofemoral compartments of   the knee with a significant synovitis and associated effusion.      PROCEDURE:  Right total knee replacement.      COMPONENTS USED:  DePuy rotating platform posterior stabilized knee   system, a size 2.5 femur, 2.5 tibia, 12.5 mm PS insert, and 35 patellar   button.      SURGEON:  Pietro Cassis. Alvan Dame, M.D.      ASSISTANT:  Danae Orleans, PA-C.      ANESTHESIA:  Spinal.      SPECIMENS:  None.      COMPLICATION:  None.      DRAINS:  None.  EBL: <100cc      TOURNIQUET TIME:   Total Tourniquet Time Documented: Thigh (Right) - 29 minutes Total: Thigh (Right) - 29 minutes  .      The patient was stable to the recovery room.      INDICATION FOR PROCEDURE:  Christina Crawford is a 63 y.o. female patient of   mine.  The patient had been seen, evaluated, and treated conservatively in the   office with medication, activity modification, and injections.  The patient had   radiographic changes of bone-on-bone arthritis with endplate sclerosis and osteophytes noted.      The patient failed conservative measures including medication, injections, and activity modification, and at this point was ready for more definitive measures.   Based on the radiographic changes and failed conservative measures, the patient   decided to proceed with total knee replacement.  Risks of infection,   DVT, component failure, need for  revision surgery, postop course, and   expectations were all   discussed and reviewed.  Consent was obtained for benefit of pain   relief.      PROCEDURE IN DETAIL:  The patient was brought to the operative theater.   Once adequate anesthesia, preoperative antibiotics, 900mg  of Cleocin administered, the patient was positioned supine with the right thigh tourniquet placed.  The  right lower extremity was prepped and draped in sterile fashion.  A time-   out was performed identifying the patient, planned procedure, and   extremity.      The right lower extremity was placed in the [facility] leg holder.  The leg was   exsanguinated, tourniquet elevated to 250 mmHg.  A midline incision was   made followed by median parapatellar arthrotomy.  Following initial   exposure, attention was first directed  to the patella.  Precut   measurement was noted to be 21 mm.  I resected down to 14 mm and used a   35 patellar button to restore patellar height as well as cover the cut   surface.      The lug holes were drilled and a metal shim was placed to protect the   patella from retractors and saw blades.      At this point, attention was now directed to the femur.  The femoral   canal was opened with a drill, irrigated to try to prevent fat emboli.  An   intramedullary rod was passed at 5 degrees valgus, 10 mm of bone was   resected off the distal femur.  Following this resection, the tibia was   subluxated anteriorly.  Using the extramedullary guide, 2 mm of bone was resected off   the proximal lateral tibia.  We confirmed the gap would be   stable medially and laterally with a 10 mm insert as well as confirmed   the cut was perpendicular in the coronal plane, checking with an alignment rod.      Once this was done, I sized the femur to be a size 2.5 in the anterior-   posterior dimension, chose a standard component based on medial and   lateral dimension.  The size 2.5 rotation block was then pinned in    position anterior referenced using the C-clamp to set rotation.  The   anterior, posterior, and  chamfer cuts were made without difficulty nor   notching making certain that I was along the anterior cortex to help   with flexion gap stability.      The final box cut was made off the lateral aspect of distal femur.      At this point, the tibia was sized to be a size 2.5, the size 2.5 tray was   then pinned in position through the medial third of the tubercle,   drilled, and keel punched.  Trial reduction was now carried with a 2.5 femur,  2.5 tibia, a 10-12.5 mm insert, and the 35 patella botton.  The knee was brought to   extension, full extension with good flexion stability with the patella   tracking through the trochlea without application of pressure.  Given   all these findings, the trial components removed.  Final components were   opened and cement was mixed.  The knee was irrigated with normal saline   solution and pulse lavage.  The synovial lining was   then injected with 30cc of 0.25% Marcaine with epinephrine and 1 cc of Toradol plus 30cc of NS,   total of 61 cc.      The knee was irrigated.  Final implants were then cemented onto clean and   dried cut surfaces of bone with the knee brought to extension with a 12.5 mm trial insert.      Once the cement had fully cured, the excess cement was removed   throughout the knee.  I confirmed I was satisfied with the range of   motion and stability, and the final 12.5 mm PS insert was chosen.  It was   placed into the knee.      The tourniquet had been let down at 29 minutes.  No significant   hemostasis required.  The   extensor mechanism was then reapproximated using #1 Vicryl with the knee   in flexion.  The   remaining wound was  closed with 2-0 Vicryl and running 4-0 Monocryl.   The knee was cleaned, dried, dressed sterilely using Dermabond and   Aquacel dressing.  The patient was then   brought to recovery room in stable  condition, tolerating the procedure   well.   Please note that Physician Assistant, Danae Orleans, PA-C, was present for the entirety of the case, and was utilized for pre-operative positioning, peri-operative retractor management, general facilitation of the procedure.  He was also utilized for primary wound closure at the end of the case.              Pietro Cassis Alvan Dame, M.D.    11/11/2013 8:39 AM

## 2013-11-11 NOTE — Evaluation (Signed)
Physical Therapy Evaluation Patient Details Name: VERNEDA HOLLOPETER MRN: [id number] DOB: 05-01-1950 Today's Date: 11/11/2013   History of Present Illness  Pt is a 63 year old female s/p R TKA  Clinical Impression  Pt is s/p R TKA resulting in the deficits listed below (see PT Problem List).  Pt will benefit from skilled PT to increase their independence and safety with mobility to allow discharge to the venue listed below.   Pt limited with mobility POD #0 due to pain (IV premedicated) and nausea.  Pt assisted OOB to recliner.  Pt reports d/c plan to sister's home (no steps) and then in a couple days to her home which has 3 steps which she would like to practice prior to d/c from hospital.     Follow Up Recommendations Home health PT    Equipment Recommendations  Rolling walker with 5" wheels    Recommendations for Other Services       Precautions / Restrictions Precautions Precautions: Knee Restrictions Other Position/Activity Restrictions: WBAT      Mobility  Bed Mobility Overal bed mobility: Needs Assistance Bed Mobility: Supine to Sit     Supine to sit: Max assist;+2 for physical assistance     General bed mobility comments: assist for scooting to EOB and R LE  Transfers Overall transfer level: Needs assistance Equipment used: Rolling walker (2 wheeled) Transfers: Sit to/from [facility] Sit to Stand: Min assist;+2 safety/equipment;From elevated surface Stand pivot transfers: +2 safety/equipment;Min assist       General transfer comment: verbal cues for technique, difficulty with rise initially but better with cue to use L side to assist, step by step instructions for bed to chair, pain with mobility so only to recliner today  Ambulation/Gait                Stairs            Wheelchair Mobility    Modified Rankin (Stroke Patients Only)       Balance                                             Pertinent  Vitals/Pain Pain Assessment: 0-10 Pain Score: 6  Pain Location: R knee Pain Descriptors / Indicators: Aching;Sore Pain Intervention(s): Limited activity within patient's tolerance;Monitored during session;Repositioned;Premedicated before session    [place] expects to be discharged to:: Private residence Living Arrangements: Spouse/significant other Available Help at Discharge: Family (going to sisters home for a couple days) Type of Home: House Home Access: Stairs to enter Entrance Stairs-Rails: Right Entrance Stairs-Number of Steps: 3 Home Layout: One level        Prior Function Level of Independence: Independent               Hand Dominance        Extremity/Trunk Assessment               Lower Extremity Assessment: RLE deficits/detail RLE Deficits / Details: limited knee motion due to pain       Communication   Communication: No difficulties  Cognition Arousal/Alertness: Awake/alert Behavior During Therapy: WFL for tasks assessed/performed Overall Cognitive Status: Within Functional Limits for tasks assessed                      General Comments      Exercises  Assessment/Plan    PT Assessment Patient needs continued PT services  PT Diagnosis Difficulty walking;Acute pain   PT Problem List Decreased strength;Decreased range of motion;Decreased mobility;Decreased knowledge of use of DME;Pain  PT Treatment Interventions Functional mobility training;Gait training;DME instruction;Stair training;Patient/family education;Therapeutic activities;Therapeutic exercise   PT Goals (Current goals can be found in the Care Plan section) Acute Rehab PT Goals PT Goal Formulation: With patient Time For Goal Achievement: 11/15/13 Potential to Achieve Goals: Good    Frequency 7X/week   Barriers to discharge        Co-evaluation               End of Session Equipment Utilized During Treatment: Oxygen Activity  Tolerance: Patient limited by pain Patient left: in chair;with call bell/phone within reach Nurse Communication: Mobility status         Time: 9326-7124 PT Time Calculation (min): 13 min   Charges:   PT Evaluation $Initial PT Evaluation Tier I: 1 Procedure PT Treatments $Therapeutic Activity: 8-22 mins   PT G Codes:          Demauri Advincula,[name] 11/11/2013, 3:37 PM Carmelia Bake, PT, DPT 11/11/2013 Pager: [phone]

## 2013-11-11 NOTE — Interval H&P Note (Signed)
History and Physical Interval Note:  11/11/2013 7:13 AM  Christina Crawford  has presented today for surgery, with the diagnosis of RIGHT KNEE OSTEOARTHRITIS  The various methods of treatment have been discussed with the patient and family. After consideration of risks, benefits and other options for treatment, the patient has consented to  Procedure(s): RIGHT TOTAL KNEE ARTHROPLASTY (Right) as a surgical intervention .  The patient's history has been reviewed, patient examined, no change in status, stable for surgery.  I have reviewed the patient's chart and labs.  Questions were answered to the patient's satisfaction.     Mauri Pole

## 2013-11-11 NOTE — Anesthesia Procedure Notes (Signed)
Spinal Patient location during procedure: OR Start time: 11/11/2013 7:25 AM End time: 11/11/2013 7:30 AM Staffing Anesthesiologist: Milana Obey Resident/CRNA: Darlys Gales R Performed by: resident/CRNA  Preanesthetic Checklist Completed: patient identified, site marked, surgical consent, pre-op evaluation, timeout performed, IV checked, risks and benefits discussed and monitors and equipment checked Spinal Block Patient position: sitting Prep: ChloraPrep Patient monitoring: heart rate, cardiac monitor, continuous pulse ox and blood pressure Approach: midline Location: L3-4 Injection technique: single-shot Needle Needle type: Spinocan  Needle gauge: 22 G Needle length: 9 cm Needle insertion depth: 7.5 cm Assessment Sensory level: T4

## 2013-11-11 NOTE — Transfer of Care (Signed)
Immediate Anesthesia Transfer of Care Note  Patient: Christina Crawford  Procedure(s) Performed: Procedure(s) (LRB): RIGHT TOTAL KNEE ARTHROPLASTY (Right)  Patient Location: PACU  Anesthesia Type: Spinal  Level of Consciousness: awake, patient cooperative and responds to stimulation  Airway & Oxygen Therapy: Patient Spontanous Breathing and Patient connected to face mask oxgen  Post-op Assessment: Report given to PACU RN and Post -op Vital signs reviewed and stable  Post vital signs: Reviewed and stable  Complications: No apparent anesthesia complications

## 2013-11-11 NOTE — Plan of Care (Signed)
Problem: Phase I Progression Outcomes Goal: CMS/Neurovascular status WDL Outcome: Completed/Met Date Met:  11/11/13 Goal: Pain controlled with appropriate interventions Outcome: Completed/Met Date Met:  11/11/13 Goal: Dangle or out of bed evening of surgery Outcome: Completed/Met Date Met:  11/11/13 Goal: Hemodynamically stable Outcome: Completed/Met Date Met:  11/11/13

## 2013-11-11 NOTE — Progress Notes (Signed)
Utilization review completed.  

## 2013-11-12 ENCOUNTER — Encounter (HOSPITAL_COMMUNITY): Payer: Self-pay | Admitting: Orthopedic Surgery

## 2013-11-12 DIAGNOSIS — E669 Obesity, unspecified: Secondary | ICD-10-CM | POA: Diagnosis present

## 2013-11-12 LAB — BASIC METABOLIC PANEL
ANION GAP: 13 (ref 5–15)
BUN: 10 mg/dL (ref 6–23)
CO2: 27 meq/L (ref 19–32)
CREATININE: 0.6 mg/dL (ref 0.50–1.10)
Calcium: 8.9 mg/dL (ref 8.4–10.5)
Chloride: 104 mEq/L (ref 96–112)
GFR calc Af Amer: >90 mL/min (ref >90)
GFR calc non Af Amer: >90 mL/min (ref >90)
Glucose, Bld: 130 mg/dL — ABNORMAL HIGH (ref 70–99)
Potassium: 4 mEq/L (ref 3.7–5.3)
Sodium: 144 mEq/L (ref 137–147)

## 2013-11-12 LAB — CBC
HCT: 35.1 % — ABNORMAL LOW (ref 36.0–46.0)
Hemoglobin: 11.5 g/dL — ABNORMAL LOW (ref 12.0–15.0)
MCH: 28.3 pg (ref 26.0–34.0)
MCHC: 32.8 g/dL (ref 30.0–36.0)
MCV: 86.2 fL (ref 78.0–100.0)
PLATELETS: 241 10*3/uL (ref 150–400)
RBC: 4.07 MIL/uL (ref 3.87–5.11)
RDW: 13.2 % (ref 11.5–15.5)
WBC: 11.9 10*3/uL — ABNORMAL HIGH (ref 4.0–10.5)

## 2013-11-12 MED ORDER — PROMETHAZINE HCL 25 MG/ML IJ SOLN: 6.2500 mg | Freq: Four times a day (QID) | INTRAMUSCULAR | Status: DC | PRN

## 2013-11-12 MED ORDER — KETOROLAC TROMETHAMINE 15 MG/ML IJ SOLN
15.0000 mg | Freq: Four times a day (QID) | INTRAMUSCULAR | Status: DC
  Administered 2013-11-12 – 2013-11-13 (×2): 15 mg via INTRAVENOUS
  Filled 2013-11-12 (×7): qty 1

## 2013-11-12 NOTE — Progress Notes (Signed)
Physical Therapy Treatment Note    11/12/13 1600  PT Visit Information  Last PT Received On 11/12/13  Assistance Needed +1  History of Present Illness Pt is a 63 year old female s/p R TKA  PT Time Calculation  PT Start Time 1457  PT Stop Time 1511  PT Time Calculation (min) 14 min  Subjective Data  Subjective Pt reports nausea after taking pain meds earlier however feeling a little better now and agreeable to ambulate this afternoon.  Precautions  Precautions Knee  Restrictions  Other Position/Activity Restrictions WBAT  Pain Assessment  Pain Assessment 0-10  Pain Score 7  Pain Location R knee  Pain Descriptors / Indicators Sore  Pain Intervention(s) Monitored during session;Limited activity within patient's tolerance;Repositioned;Ice applied  Cognition  Arousal/Alertness Awake/alert  Behavior During Therapy WFL for tasks assessed/performed  Overall Cognitive Status Within Functional Limits for tasks assessed  Bed Mobility  Overal bed mobility Needs Assistance  Bed Mobility Supine to Sit;Sit to Supine  Supine to sit Min guard  Sit to supine Min assist  General bed mobility comments assist for LE back onto bed for pain control  Transfers  Overall transfer level Needs assistance  Equipment used Rolling walker (2 wheeled)  Transfers Sit to/from Stand  Sit to Stand Min guard  General transfer comment verbal cues for UE and LE positioning  Ambulation/Gait  Ambulation/Gait assistance Min guard  Ambulation Distance (Feet) 80 Feet  Assistive device Rolling walker (2 wheeled)  Gait Pattern/deviations Step-to pattern;Antalgic  General Gait Details verbal cues for sequence, RW positioning, step length, posture  PT - End of Session  Activity Tolerance Patient limited by pain  Patient left in bed;with call bell/phone within reach  PT - Assessment/Plan  PT Plan Current plan remains appropriate  PT Frequency 7X/week  Follow Up Recommendations Home health PT  PT equipment Rolling  walker with 5" wheels  PT Goal Progression  Progress towards PT goals Progressing toward goals  PT General Charges  $$ ACUTE PT VISIT 1 Procedure  PT Treatments  $Gait Training 8-22 mins   Carmelia Bake, PT, DPT 11/12/2013 Pager: [phone]

## 2013-11-12 NOTE — Progress Notes (Signed)
     Subjective: 1 Day Post-Op Procedure(s) (LRB): RIGHT TOTAL KNEE ARTHROPLASTY (Right)   Patient reports pain as moderate, pain not full controlled. No events throughout the night.   Objective:   VITALS:   Filed Vitals:   11/12/13 0442  BP: 145/64  Pulse: 65  Temp: 98.6 F (37 C)  Resp: 16    Dorsiflexion/Plantar flexion intact Incision: dressing C/D/I No cellulitis present Compartment soft  LABS  Recent Labs  11/12/13 0432  HGB 11.5*  HCT 35.1*  WBC 11.9*  PLT 241     Recent Labs  11/12/13 0432  NA 144  K 4.0  BUN 10  CREATININE 0.60  GLUCOSE 130*     Assessment/Plan: 1 Day Post-Op Procedure(s) (LRB): RIGHT TOTAL KNEE ARTHROPLASTY (Right) Foley cath d/c'ed Advance diet Up with therapy D/C IV fluids Discharge home with home health eventually, when ready  Obese (BMI 30-39.9) Estimated body mass index is 36.03 kg/(m^2) as calculated from the following:   Height as of this encounter: 5\' 4"  (1.626 m).   Weight as of this encounter: 95.255 kg (210 lb). Patient also counseled that weight may inhibit the healing process Patient counseled that losing weight will help with future health issues       [place]. Nagi Furio   PAC  11/12/2013, 9:02 AM

## 2013-11-12 NOTE — Evaluation (Signed)
Occupational Therapy Evaluation Patient Details Name: CAREN GARSKE MRN: [id number] DOB: [birth year] Today's Date: 11/12/2013    History of Present Illness Pt is a 63 year old female s/p R TKA   Clinical Impression   Pt is s/p TKA resulting in the deficits listed below (see OT Problem List).  Pt will benefit from skilled OT to increase their safety and independence with ADL and functional mobility for ADL to facilitate discharge to venue listed below.        Follow Up Recommendations  No OT follow up    Equipment Recommendations  None recommended by OT    Recommendations for Other Services       Precautions / Restrictions Precautions Precautions: Knee Restrictions Other Position/Activity Restrictions: WBAT      Mobility Bed Mobility Overal bed mobility: + 2 for safety/equipment Bed Mobility: Supine to Sit     Supine to sit: Min guard     General bed mobility comments: pt up in recliner on arrival  Transfers Overall transfer level: Needs assistance Equipment used: Rolling walker (2 wheeled) Transfers: Sit to/from Stand Sit to Stand: Min guard Stand pivot transfers: Min guard       General transfer comment: verbal cues for UE and LE positioning    Balance                                            ADL Overall ADL's : Needs assistance/impaired             Lower Body Bathing: Moderate assistance;Sit to/from stand       Lower Body Dressing: Moderate assistance;Sit to/from stand   Toilet Transfer: Minimal assistance;RW   Toileting- Clothing Manipulation and Hygiene: Minimal assistance;Sit to/from stand               Vision                     Perception     Praxis      Pertinent Vitals/Pain Pain Assessment: 0-10 Pain Score: 7  Pain Location: R knee Pain Descriptors / Indicators: Sore Pain Intervention(s): Limited activity within patient's tolerance;RN gave pain meds during session;Ice applied;Monitored  during session     Hand Dominance     Extremity/Trunk Assessment Upper Extremity Assessment Upper Extremity Assessment: Overall WFL for tasks assessed           Communication Communication Communication: No difficulties   Cognition Arousal/Alertness: Awake/alert Behavior During Therapy: WFL for tasks assessed/performed Overall Cognitive Status: Within Functional Limits for tasks assessed                     General Comments       Exercises       Shoulder Instructions      Home Living Family/patient expects to be discharged to:: Private residence Living Arrangements: Spouse/significant other Available Help at Discharge: Family (going to sisters home for a couple days) Type of Home: House Home Access: Stairs to enter [employer] of Steps: 3 Entrance Stairs-Rails: Right Home Layout: One level     Bathroom Shower/Tub: Occupational psychologist: Handicapped height                Prior Functioning/Environment Level of Independence: Independent             OT Diagnosis: Generalized weakness;Acute pain  OT Problem List: Decreased strength;Pain   OT Treatment/Interventions: Self-care/ADL training;Patient/family education    OT Goals(Current goals can be found in the care plan section) Acute Rehab OT Goals Patient Stated Goal: to sisters house OT Goal Formulation: With patient Time For Goal Achievement: 11/26/13 Potential to Achieve Goals: Good  OT Frequency: Min 2X/week   Barriers to D/C:            Co-evaluation              End of Session Equipment Utilized During Treatment: Rolling walker Nurse Communication: Mobility status  Activity Tolerance: Patient tolerated treatment well Patient left: in bed;with call bell/phone within reach   Time: 1227-1244 OT Time Calculation (min): 17 min Charges:  OT General Charges $OT Visit: 1 Procedure OT Evaluation $Initial OT Evaluation Tier I: 1 Procedure OT  Treatments $Self Care/Home Management : 8-22 mins G-Codes:    Payton Mccallum D 2013-12-04, 1:22 PM

## 2013-11-12 NOTE — Progress Notes (Signed)
Physical Therapy Treatment Patient Details Name: Christina Crawford MRN: [id number] DOB: September 13, 1950 Today's Date: 11/12/2013    History of Present Illness Pt is a 63 year old female s/p R TKA    PT Comments    Pt ambulated in hallway and performed LE exercises.  Plans to d/c tomorrow.  Follow Up Recommendations  Home health PT     Equipment Recommendations  Rolling walker with 5" wheels    Recommendations for Other Services       Precautions / Restrictions Precautions Precautions: Knee Restrictions Other Position/Activity Restrictions: WBAT    Mobility  Bed Mobility Overal bed mobility: + 2 for safety/equipment Bed Mobility: Supine to Sit     Supine to sit: Min guard     General bed mobility comments: pt up in recliner on arrival  Transfers Overall transfer level: Needs assistance Equipment used: Rolling walker (2 wheeled) Transfers: Sit to/from Stand Sit to Stand: Min guard Stand pivot transfers: Min guard       General transfer comment: verbal cues for UE and LE positioning  Ambulation/Gait Ambulation/Gait assistance: Min guard Ambulation Distance (Feet): 80 Feet Assistive device: Rolling walker (2 wheeled) Gait Pattern/deviations: Step-to pattern;Trunk flexed;Antalgic;Decreased stance time - right     General Gait Details: verbal cues for sequence, RW positioning, step length, posture   Stairs            Wheelchair Mobility    Modified Rankin (Stroke Patients Only)       Balance                                    Cognition Arousal/Alertness: Awake/alert Behavior During Therapy: WFL for tasks assessed/performed Overall Cognitive Status: Within Functional Limits for tasks assessed                      Exercises Total Joint Exercises Ankle Circles/Pumps: AROM;Both;15 reps Quad Sets: AROM;Both;10 reps Towel Squeeze: AROM;Both;10 reps Short Arc Quad: AAROM;10 reps;Right Heel Slides: AAROM;Right;10 reps Hip  ABduction/ADduction: AROM;Right;10 reps Straight Leg Raises: AAROM;Right;10 reps    General Comments        Pertinent Vitals/Pain Pain Assessment: 0-10 Pain Score: 7  Pain Location: R knee Pain Descriptors / Indicators: Sore Pain Intervention(s): Limited activity within patient's tolerance;RN gave pain meds during session;Ice applied;Monitored during session    Home Living Family/patient expects to be discharged to:: Private residence Living Arrangements: Spouse/significant other Available Help at Discharge: Family (going to sisters home for a couple days) Type of Home: House Home Access: Stairs to enter Entrance Stairs-Rails: Right Home Layout: One level        Prior Function Level of Independence: Independent          PT Goals (current goals can now be found in the care plan section) Acute Rehab PT Goals Patient Stated Goal: to sisters house Progress towards PT goals: Progressing toward goals    Frequency  7X/week    PT Plan Current plan remains appropriate    Co-evaluation             End of Session Equipment Utilized During Treatment: Gait belt Activity Tolerance: Patient limited by pain Patient left: with call bell/phone within reach;in bed     Time: 1012-1037 PT Time Calculation (min): 25 min  Charges:  $Gait Training: 8-22 mins $Therapeutic Exercise: 8-22 mins  G Codes:      Christina Crawford,Christina Crawford 11/22/13, 1:13 PM Christina Crawford, PT, DPT 2013-11-22 Pager: [phone]

## 2013-11-12 NOTE — Care Management Note (Addendum)
    Page 1 of 2   11/12/2013     11:50:29 AM CARE MANAGEMENT NOTE 11/12/2013  Patient:  TOSHA, BELGARDE   Account Number:  [id number]  Date Initiated:  11/12/2013  Documentation initiated by:  [facility]  Subjective/Objective Assessment:   adm: RIGHT TOTAL KNEE ARTHROPLASTY (Right)     Action/Plan:   discharge planning   Anticipated DC Date:  11/12/2013   Anticipated DC Plan:  [place]  CM consult      [facility] Choice  HOME HEALTH   Choice offered to / List presented to:  C-1 Patient   DME arranged  NA      DME agency  NA     [place] arranged  HH-2 PT      [place]   Status of service:  Completed, signed off Medicare Important Message given?   (If response is "NO", the following Medicare IM given date fields will be blank) Date Medicare IM given:   Medicare IM given by:   Date Additional Medicare IM given:   Additional Medicare IM given by:    Discharge Disposition:  [place]  Per UR Regulation:    If discussed at Long Length of Stay Meetings, dates discussed:    Comments:  08:15 CM met with pt in room to offer choice of home health agency.  Pt chooses [facility] to render HHPT.  Pt states she will be going to her sister Diane's address: [address] [place], [place] [address] and please use her mobile as contact number.  Pt states her sister had the same surgery and has the DME.  CM called [facility] DME rep, Lecretia to notify her of NO DME needs.  Referral called Shaune Leeks with correct address.  No other CM needs were communicated. Mariane Masters, BSN, [place].

## 2013-11-12 NOTE — Progress Notes (Deleted)
OT Cancellation Note  Patient Details Name: Christina Crawford MRN: [id number] DOB: 1950/07/20   Cancelled Treatment:    Reason Eval/Treat Not Completed: Other (comment);Pain limiting ability to participate  Pt also feeling sick and had taken meds recently.  Will recheck on pt later in day or in morn first thing. Thanks,  Betsy Pries 11/12/2013, 12:28 PM

## 2013-11-13 LAB — CBC
HEMATOCRIT: 32.8 % — AB (ref 36.0–46.0)
Hemoglobin: 10.9 g/dL — ABNORMAL LOW (ref 12.0–15.0)
MCH: 28.8 pg (ref 26.0–34.0)
MCHC: 33.2 g/dL (ref 30.0–36.0)
MCV: 86.5 fL (ref 78.0–100.0)
Platelets: 213 10*3/uL (ref 150–400)
RBC: 3.79 MIL/uL — ABNORMAL LOW (ref 3.87–5.11)
RDW: 13.6 % (ref 11.5–15.5)
WBC: 10.4 10*3/uL (ref 4.0–10.5)

## 2013-11-13 LAB — BASIC METABOLIC PANEL
Anion gap: 9 (ref 5–15)
BUN: 12 mg/dL (ref 6–23)
CALCIUM: 8.9 mg/dL (ref 8.4–10.5)
CO2: 30 mEq/L (ref 19–32)
CREATININE: 0.66 mg/dL (ref 0.50–1.10)
Chloride: 105 mEq/L (ref 96–112)
Glucose, Bld: 119 mg/dL — ABNORMAL HIGH (ref 70–99)
POTASSIUM: 4.4 meq/L (ref 3.7–5.3)
Sodium: 144 mEq/L (ref 137–147)

## 2013-11-13 MED ORDER — TIZANIDINE HCL 4 MG PO TABS: 4.0000 mg | ORAL_TABLET | Freq: Four times a day (QID) | ORAL | Status: DC | PRN

## 2013-11-13 MED ORDER — DSS 100 MG PO CAPS: 100.0000 mg | ORAL_CAPSULE | Freq: Two times a day (BID) | ORAL | Status: DC

## 2013-11-13 MED ORDER — PROMETHAZINE HCL 12.5 MG PO TABS: 12.5000 mg | ORAL_TABLET | Freq: Four times a day (QID) | ORAL | Status: DC | PRN

## 2013-11-13 MED ORDER — HYDROCODONE-ACETAMINOPHEN 7.5-325 MG PO TABS: 1.0000 | ORAL_TABLET | ORAL | Status: DC | PRN

## 2013-11-13 MED ORDER — POLYETHYLENE GLYCOL 3350 17 G PO PACK: 17.0000 g | PACK | Freq: Two times a day (BID) | ORAL | Status: DC

## 2013-11-13 MED ORDER — FERROUS SULFATE 325 (65 FE) MG PO TABS: 325.0000 mg | ORAL_TABLET | Freq: Three times a day (TID) | ORAL | Status: DC

## 2013-11-13 MED ORDER — ASPIRIN 325 MG PO TBEC: 325.0000 mg | DELAYED_RELEASE_TABLET | Freq: Two times a day (BID) | ORAL | Status: DC

## 2013-11-13 NOTE — Plan of Care (Signed)
Problem: Phase II Progression Outcomes Goal: Ambulates Outcome: Completed/Met Date Met:  11/13/13 Goal: Tolerating diet Outcome: Completed/Met Date Met:  11/13/13

## 2013-11-13 NOTE — Progress Notes (Signed)
Occupational Therapy Treatment Patient Details Name: Christina Crawford MRN: [id number] DOB: 09-Mar-1950 Today's Date: 11/13/2013    History of present illness Pt is a 63 year old female s/p R TKA      Follow Up Recommendations  No OT follow up    Equipment Recommendations  None recommended by OT    Recommendations for Other Services      Precautions / Restrictions Precautions Precautions: Knee Restrictions Weight Bearing Restrictions: No Other Position/Activity Restrictions: WBAT       Mobility Bed Mobility Overal bed mobility: Needs Assistance Bed Mobility: Supine to Sit     Supine to sit: Supervision     General bed mobility comments: verbal cues for self assist of R LE  Transfers Overall transfer level: Needs assistance Equipment used: Rolling walker (2 wheeled) Transfers: Sit to/from Stand Sit to Stand: Supervision         General transfer comment: vebral cues for UE and LE positioning    Balance                                   ADL                                         General ADL Comments: discussed safety with getting dressed as well as in and out or the shower. Pt able to verbalize technique for both- but did not practice shower due to pain level. Pt was shivering and has just taken pain meds. OT provided pt with warm blankets.  Family present                Cognition   Behavior During Therapy: WFL for tasks assessed/performed Overall Cognitive Status: Within Functional Limits for tasks assessed                       Extremity/Trunk Assessment               Exercises Total Joint Exercises Ankle Circles/Pumps: AROM;Both;15 reps Quad Sets: AROM;Both;10 reps Towel Squeeze: AROM;Both;10 reps Short Arc Quad: AAROM;10 reps;Right Heel Slides: AAROM;Right;10 reps Hip ABduction/ADduction: AROM;Right;10 reps Straight Leg Raises: AAROM;Right;10 reps   Shoulder Instructions       General  Comments      Pertinent Vitals/ Pain       Pain Assessment: 0-10 Pain Score: 8  Pain Location: R knee Pain Descriptors / Indicators: Sore;Discomfort Pain Intervention(s): Limited activity within patient's tolerance;Premedicated before session  Home Living                                          Prior Functioning/Environment              Frequency Min 2X/week     Progress Toward Goals  OT Goals(current goals can now be found in the care plan section)  Progress towards OT goals: Progressing toward goals     Plan      Co-evaluation                 End of Session Equipment Utilized During Treatment: Rolling walker   Activity Tolerance Patient tolerated treatment well   Patient Left in bed;with call bell/phone within reach   Nurse  Communication Mobility status        Time: 1012-1025 OT Time Calculation (min): 13 min  Charges: OT General Charges $OT Visit: 1 Procedure OT Treatments $Self Care/Home Management : 8-22 mins  [place], Thereasa Parkin 11/13/2013, 10:44 AM

## 2013-11-13 NOTE — Progress Notes (Signed)
     Subjective: 2 Days Post-Op Procedure(s) (LRB): RIGHT TOTAL KNEE ARTHROPLASTY (Right)   Patient reports pain as mild. States that she does have some pain, but it is well controlled. No events throughout the night. Ready to be discharged home.  Objective:   VITALS:   Filed Vitals:   11/13/13 0555  BP: 151/49  Pulse: 56  Temp: 98.6 F (37 C)  Resp: 14    Dorsiflexion/Plantar flexion intact Incision: dressing C/D/I No cellulitis present Compartment soft  LABS  Recent Labs  11/12/13 0432 11/13/13 0453  HGB 11.5* 10.9*  HCT 35.1* 32.8*  WBC 11.9* 10.4  PLT 241 213     Recent Labs  11/12/13 0432 11/13/13 0453  NA 144 144  K 4.0 4.4  BUN 10 12  CREATININE 0.60 0.66  GLUCOSE 130* 119*     Assessment/Plan: 2 Days Post-Op Procedure(s) (LRB): RIGHT TOTAL KNEE ARTHROPLASTY (Right) Up with therapy Discharge home with home health  Follow up in 2 weeks at [facility]. Follow up with OLIN,Christiano Blandon D in 2 weeks.  Contact information:  [facility] [address], [address] Jalexia Lalli   PAC  11/13/2013, 8:33 AM

## 2013-11-13 NOTE — Plan of Care (Signed)
Problem: Phase II Progression Outcomes Goal: Discharge plan established Outcome: Progressing     

## 2013-11-13 NOTE — Progress Notes (Signed)
Physical Therapy Treatment Patient Details Name: Christina Crawford MRN: [id number] DOB: January 12, 1950 Today's Date: 11/13/2013    History of Present Illness Pt is a 63 year old female s/p R TKA    PT Comments    Pt ambulated in hallway and practiced steps.  Pt also performed exercises in recliner.  Pt provided with stair and HEP handouts.  Pt reports feeling much better today and feels ready for d/c.  Follow Up Recommendations  Home health PT     Equipment Recommendations  Rolling walker with 5" wheels    Recommendations for Other Services       Precautions / Restrictions Precautions Precautions: Knee Restrictions Other Position/Activity Restrictions: WBAT    Mobility  Bed Mobility Overal bed mobility: Needs Assistance Bed Mobility: Supine to Sit     Supine to sit: Supervision     General bed mobility comments: verbal cues for self assist of R LE  Transfers Overall transfer level: Needs assistance Equipment used: Rolling walker (2 wheeled) Transfers: Sit to/from Stand Sit to Stand: Supervision         General transfer comment: vebral cues for UE and LE positioning  Ambulation/Gait Ambulation/Gait assistance: Supervision Ambulation Distance (Feet): 280 Feet Assistive device: Rolling walker (2 wheeled) Gait Pattern/deviations: Step-through pattern;Trunk flexed;Antalgic     General Gait Details: verbal cues for RW positioning, step length, posture   Stairs Stairs: Yes Stairs assistance: Min guard Stair Management: Step to pattern;Backwards;With walker Number of Stairs: 3 General stair comments: verbal cues for sequence, RW positioning, safety, provided handout, pt aware another person is needed to hold RW  Wheelchair Mobility    Modified Rankin (Stroke Patients Only)       Balance                                    Cognition Arousal/Alertness: Awake/alert Behavior During Therapy: WFL for tasks assessed/performed Overall Cognitive  Status: Within Functional Limits for tasks assessed                      Exercises Total Joint Exercises Ankle Circles/Pumps: AROM;Both;15 reps Quad Sets: AROM;Both;10 reps Towel Squeeze: AROM;Both;10 reps Short Arc Quad: AAROM;10 reps;Right Heel Slides: AAROM;Right;10 reps Hip ABduction/ADduction: AROM;Right;10 reps Straight Leg Raises: AAROM;Right;10 reps    General Comments        Pertinent Vitals/Pain Pain Assessment: 0-10 Pain Score: 4  Pain Location: R knee Pain Descriptors / Indicators: Aching;Sore Pain Intervention(s): Limited activity within patient's tolerance;Monitored during session;Premedicated before session;Repositioned;Ice applied    Home Living                      Prior Function            PT Goals (current goals can now be found in the care plan section) Progress towards PT goals: Progressing toward goals    Frequency  7X/week    PT Plan Current plan remains appropriate    Co-evaluation             End of Session   Activity Tolerance: Patient tolerated treatment well Patient left: with call bell/phone within reach;in chair     Time: 7673-4193 PT Time Calculation (min): 25 min  Charges:  $Gait Training: 8-22 mins $Therapeutic Exercise: 8-22 mins                    G Codes:  Ruven Corradi,[name] 11/13/2013, 9:26 AM Carmelia Bake, PT, DPT 11/13/2013 Pager: [phone]

## 2013-11-13 NOTE — Plan of Care (Signed)
Problem: Discharge Progression Outcomes Goal: Barriers To Progression Addressed/Resolved Outcome: Not Applicable Date Met:  90/38/33 Goal: CMS/Neurovascular status at or above baseline Outcome: Completed/Met Date Met:  11/13/13 Goal: Anticoagulant follow-up in place Outcome: Completed/Met Date Met:  11/13/13 Goal: Pain controlled with appropriate interventions Outcome: Completed/Met Date Met:  11/13/13 Goal: Hemodynamically stable Outcome: Completed/Met Date Met:  38/32/91 Goal: Complications resolved/controlled Outcome: Not Applicable Date Met:  91/66/06 Goal: Tolerates diet Outcome: Completed/Met Date Met:  11/13/13 Goal: Activity appropriate for discharge plan Outcome: Completed/Met Date Met:  11/13/13 Goal: Ambulates safely using assistive device Outcome: Completed/Met Date Met:  11/13/13 Goal: Follows weight - bearing limitations Outcome: Not Applicable Date Met:  00/45/99 Goal: Discharge plan in place and appropriate Outcome: Completed/Met Date Met:  11/13/13 Goal: Negotiates stairs Outcome: Completed/Met Date Met:  11/13/13 Goal: Demonstrates ADLs as appropriate Outcome: Completed/Met Date Met:  11/13/13 Goal: Incision without S/S infection Outcome: Completed/Met Date Met:  11/13/13

## 2013-11-19 NOTE — Discharge Summary (Signed)
Physician Discharge Summary  Patient ID: [name] MRN: [id number] DOB/AGE: [birth year] 63 y.o.  Admit date: 11/11/2013 Discharge date: 11/13/2013   Procedures:  Procedure(s) (LRB): RIGHT TOTAL KNEE ARTHROPLASTY (Right)  Attending Physician:  Dr. Paralee Cancel   Admission Diagnoses:   Right knee OA / pain  Discharge Diagnoses:  Principal Problem:   S/P right TKA Active Problems:   Obese  Past Medical History  Diagnosis Date  . Lymphedema     left arm   . PONV (postoperative nausea and vomiting)   . Family history of anesthesia complication     sister slow to wake up with general anesthesia   . Hypertension   . Hypothyroidism   . GERD (gastroesophageal reflux disease)   . Headache     not often   . Arthritis   . Cancer     left breast cancer - 2011    HPI: Christina Crawford, 63 y.o. female, has a history of pain and functional disability in the right knee due to arthritis and has failed non-surgical conservative treatments for greater than 12 weeks to include NSAID's and/or analgesics, corticosteriod injections and activity modification. Onset of symptoms was gradual, starting >10 years ago with gradually worsening course since that time. The patient noted prior procedures on the knee to include arthroscopy on the right knee(s). Patient currently rates pain in the right knee(s) at 10 out of 10 with activity. Patient has night pain, worsening of pain with activity and weight bearing, pain that interferes with activities of daily living, pain with passive range of motion, crepitus and joint swelling. Patient has evidence of periarticular osteophytes and joint space narrowing by imaging studies. There is no active infection. Risks, benefits and expectations were discussed with the patient. Risks including but not limited to the risk of anesthesia, blood clots, nerve damage, blood vessel damage, failure of the prosthesis, infection and up to and including death. Patient  understand the risks, benefits and expectations and wishes to proceed with surgery.   PCP: Redmond Pulling, MD   Discharged Condition: good  Hospital Course:  Patient underwent the above stated procedure on 11/11/2013. Patient tolerated the procedure well and brought to the recovery room in good condition and subsequently to the floor.  POD #1 BP: 145/64 ; Pulse: 65 ; Temp: 98.6 F (37 C) ; Resp: 16  Patient reports pain as moderate, pain not full controlled. No events throughout the night. Dorsiflexion/plantar flexion intact, incision: dressing C/D/I, no cellulitis present and compartment soft.   LABS  Basename    HGB  11.5  HCT  35.1   POD #2  BP: 151/49 ; Pulse: 56 ; Temp: 98.6 F (37 C) ; Resp: 14  Patient reports pain as mild. States that she does have some pain, but it is well controlled. No events throughout the night. Ready to be discharged home. Dorsiflexion/plantar flexion intact, incision: dressing C/D/I, no cellulitis present and compartment soft.   LABS  Basename    HGB  10.9  HCT  32.8    Discharge Exam: General appearance: alert, cooperative and no distress Extremities: Homans sign is negative, no sign of DVT, no edema, redness or tenderness in the calves or thighs and no ulcers, gangrene or trophic changes  Disposition: Home with follow up in 2 weeks   Follow-up Information    Follow up with [facility].   Why:  home health physical therapy   Contact information:   [place]  [address] [phone]       Follow up with Mauri Pole, MD. Schedule an appointment as soon as possible for a visit in 2 weeks.   Specialty:  Orthopedic Surgery   Contact information:   [address] [place] [address] [phone]       Discharge Instructions    Call MD / Call 911    Complete by:  As directed   If you experience chest pain or shortness of breath, CALL 911 and be transported to the hospital  emergency room.  If you develope a fever above 101 F, pus (white drainage) or increased drainage or redness at the wound, or calf pain, call your surgeon's office.     Change dressing    Complete by:  As directed   Maintain surgical dressing for 10-14 days, or until follow up in the clinic.     Constipation Prevention    Complete by:  As directed   Drink plenty of fluids.  Prune juice may be helpful.  You may use a stool softener, such as Colace (over the counter) 100 mg twice a day.  Use MiraLax (over the counter) for constipation as needed.     Diet - low sodium heart healthy    Complete by:  As directed      Discharge instructions    Complete by:  As directed   Maintain surgical dressing for 10-14 days, or until follow up in the clinic. Follow up in 2 weeks at [facility]. Call with any questions or concerns.     Driving restrictions    Complete by:  As directed   No driving for 4 weeks     Increase activity slowly as tolerated    Complete by:  As directed      TED hose    Complete by:  As directed   Use stockings (TED hose) for 2 weeks on both leg(s).  You may remove them at night for sleeping.     Weight bearing as tolerated    Complete by:  As directed   Laterality:  right  Extremity:  Lower             Medication List    STOP taking these medications        ibuprofen 200 MG tablet  Commonly known as:  ADVIL,MOTRIN     VICODIN 5-500 MG per tablet  Generic drug:  HYDROcodone-acetaminophen  Replaced by:  HYDROcodone-acetaminophen 7.5-325 MG per tablet      TAKE these medications        aspirin 325 MG EC tablet  Take 1 tablet (325 mg total) by mouth 2 (two) times daily.     calcium carbonate 600 MG Tabs tablet  Commonly known as:  OS-CAL  Take 600 mg by mouth 2 (two) times daily.     calcium-vitamin D 500-200 MG-UNIT per tablet  Commonly known as:  OSCAL WITH D  Take 1 tablet by mouth 2 (two) times daily.     CLARITIN-D 24 HOUR PO  Take 1 tablet  by mouth daily as needed (alergies/congestion).     CoQ-10 100 MG Caps  Take 1 capsule by mouth every morning.     DSS 100 MG Caps  Take 100 mg by mouth 2 (two) times daily.     ferrous sulfate 325 (65 FE) MG tablet  Take 1 tablet (325 mg total) by mouth 3 (three) times daily after meals.     FISH OIL PO  Take 1 capsule by mouth every morning.     folic acid 1 MG tablet  Commonly known as:  FOLVITE  Take 1 mg by mouth at bedtime.     hydrochlorothiazide 25 MG tablet  Commonly known as:  HYDRODIURIL  Take 25 mg by mouth at bedtime.     HYDROcodone-acetaminophen 7.5-325 MG per tablet  Commonly known as:  NORCO  Take 1-2 tablets by mouth every 4 (four) hours as needed for moderate pain.     letrozole 2.5 MG tablet  Commonly known as:  FEMARA  Take 2.5 mg by mouth at bedtime.     levothyroxine 50 MCG tablet  Commonly known as:  SYNTHROID, LEVOTHROID  Take 50 mcg by mouth daily before breakfast.     Magnesium 250 MG Tabs  Take 2 tablets by mouth every morning.     metoprolol succinate 100 MG 24 hr tablet  Commonly known as:  TOPROL-XL  Take 100 mg by mouth at bedtime.     omeprazole 40 MG capsule  Commonly known as:  PRILOSEC  Take 20 mg by mouth every morning.     polyethylene glycol packet  Commonly known as:  MIRALAX / GLYCOLAX  Take 17 g by mouth 2 (two) times daily.     promethazine 12.5 MG tablet  Commonly known as:  PHENERGAN  Take 1 tablet (12.5 mg total) by mouth every 6 (six) hours as needed for nausea or vomiting.     rosuvastatin 10 MG tablet  Commonly known as:  CRESTOR  Take 10 mg by mouth at bedtime.     SUMAtriptan 50 MG tablet  Commonly known as:  IMITREX  Take 50 mg by mouth every 2 (two) hours as needed for migraine or headache. May repeat in 2 hours if headache persists or recurs.     tiZANidine 4 MG tablet  Commonly known as:  ZANAFLEX  Take 1 tablet (4 mg total) by mouth every 6 (six) hours as needed for muscle spasms.          Signed: West Pugh. Babish   PA-C  11/19/2013, 1:19 PM

## 2014-05-13 NOTE — Patient Instructions (Addendum)
YOUR PROCEDURE IS SCHEDULED ON :  05/26/14  REPORT TO [facility] MAIN ENTRANCE FOLLOW SIGNS TO SHORT STAY CENTER AT :  10:00 AM  CALL THIS NUMBER IF YOU HAVE PROBLEMS THE MORNING OF SURGERY [phone]  REMEMBER:  DO NOT EAT FOOD  AFTER MIDNIGHT  MAY HAVE CLEAR LIQUIDS UNTIL 7:00 AM  CLEAR LIQUID DIET Foods Allowed                                                                     Foods Excluded  Coffee and tea, regular and decaf                             liquids that you cannot  Plain Jell-O in any flavor                                             see through such as: Fruit ices (not with fruit pulp)                                     milk, soups, orange juice  Iced Popsicles                                                All solid food Carbonated beverages, regular and diet                                    Cranberry, grape and apple juices Sports drinks like Gatorade Lightly seasoned clear broth or consume(fat free) Sugar, honey syrup  _____________________________________________________________________  TAKE THESE MEDICINES THE MORNING OF SURGERY:   OMEPRAZOLE / LEVOTHYROXINE  YOU MAY NOT HAVE ANY METAL ON YOUR BODY INCLUDING HAIR PINS AND PIERCING'S. DO NOT WEAR JEWELRY, MAKEUP, LOTIONS, POWDERS OR PERFUMES. DO NOT WEAR NAIL POLISH. DO NOT SHAVE 48 HRS PRIOR TO SURGERY. MEN MAY SHAVE FACE AND NECK.  DO NOT [place]. [facility] IS NOT RESPONSIBLE FOR VALUABLES.  CONTACTS, DENTURES OR PARTIALS MAY NOT BE WORN TO SURGERY. LEAVE SUITCASE IN CAR. CAN BE BROUGHT TO ROOM AFTER SURGERY.  PATIENTS DISCHARGED THE DAY OF SURGERY WILL NOT BE ALLOWED TO DRIVE HOME.  PLEASE READ OVER THE FOLLOWING INSTRUCTION SHEETS _________________________________________________________________________________                                          Christina Crawford - PREPARING FOR SURGERY  Before surgery, you can play an important role.  Because skin  is not sterile, your skin needs to be as free of germs as possible.  You can reduce the number of germs on your skin by washing with CHG (chlorahexidine gluconate) soap before surgery.  CHG is an antiseptic cleaner which kills germs and bonds with the skin to continue killing germs even after washing. Please DO NOT use if you have an allergy to CHG or antibacterial soaps.  If your skin becomes reddened/irritated stop using the CHG and inform your nurse when you arrive at Short Stay. Do not shave (including legs and underarms) for at least 48 hours prior to the first CHG shower.  You may shave your face. Please follow these instructions carefully:   1.  Shower with CHG Soap the night before surgery and the  morning of Surgery.   2.  If you choose to wash your hair, wash your hair first as usual with your  normal  Shampoo.   3.  After you shampoo, rinse your hair and body thoroughly to remove the  shampoo.                                         4.  Use CHG as you would any other liquid soap.  You can apply chg directly  to the skin and wash . Gently wash with scrungie or clean wascloth    5.  Apply the CHG Soap to your body ONLY FROM THE NECK DOWN.   Do not use on open                           Wound or open sores. Avoid contact with eyes, ears mouth and genitals (private parts).                        Genitals (private parts) with your normal soap.              6.  Wash thoroughly, paying special attention to the area where your surgery  will be performed.   7.  Thoroughly rinse your body with warm water from the neck down.   8.  DO NOT shower/wash with your normal soap after using and rinsing off  the CHG Soap .                9.  Pat yourself dry with a clean towel.             10.  Wear clean night clothes to bed after shower             11.  Place clean sheets on your bed the night of your first shower and do not  sleep with pets.  Day of Surgery : Do not apply any  lotions/deodorants the morning of surgery.  Please wear clean clothes to the hospital/surgery center.  FAILURE TO FOLLOW THESE INSTRUCTIONS MAY RESULT IN THE CANCELLATION OF YOUR SURGERY    PATIENT SIGNATURE_________________________________  ______________________________________________________________________     Christina Crawford  An incentive spirometer is a tool that can help keep your lungs clear and active. This tool measures how well you are filling your lungs with each breath. Taking long deep breaths may help reverse or decrease the chance of developing breathing (pulmonary) problems (especially infection) following:  A long period of time when you are unable to move or be active. BEFORE THE PROCEDURE   If the spirometer includes an indicator to show your best effort, your nurse or respiratory therapist will set it to a desired goal.  If possible, sit up straight  or lean slightly forward. Try not to slouch.  Hold the incentive spirometer in an upright position. INSTRUCTIONS FOR USE   Sit on the edge of your bed if possible, or sit up as far as you can in bed or on a chair.  Hold the incentive spirometer in an upright position.  Breathe out normally.  Place the mouthpiece in your mouth and seal your lips tightly around it.  Breathe in slowly and as deeply as possible, raising the piston or the ball toward the top of the column.  Hold your breath for 3-5 seconds or for as long as possible. Allow the piston or ball to fall to the bottom of the column.  Remove the mouthpiece from your mouth and breathe out normally.  Rest for a few seconds and repeat Steps 1 through 7 at least 10 times every 1-2 hours when you are awake. Take your time and take a few normal breaths between deep breaths.  The spirometer may include an indicator to show your best effort. Use the indicator as a goal to work toward during each repetition.  After each set of 10 deep breaths, practice  coughing to be sure your lungs are clear. If you have an incision (the cut made at the time of surgery), support your incision when coughing by placing a pillow or rolled up towels firmly against it. Once you are able to get out of bed, walk around indoors and cough well. You may stop using the incentive spirometer when instructed by your caregiver.  RISKS AND COMPLICATIONS  Take your time so you do not get dizzy or light-headed.  If you are in pain, you may need to take or ask for pain medication before doing incentive spirometry. It is harder to take a deep breath if you are having pain. AFTER USE  Rest and breathe slowly and easily.  It can be helpful to keep track of a log of your progress. Your caregiver can provide you with a simple table to help with this. If you are using the spirometer at home, follow these instructions: [place] IF:   You are having difficultly using the spirometer.  You have trouble using the spirometer as often as instructed.  Your pain medication is not giving enough relief while using the spirometer.  You develop fever of 100.5 F (38.1 C) or higher. SEEK IMMEDIATE MEDICAL CARE IF:   You cough up bloody sputum that had not been present before.  You develop fever of 102 F (38.9 C) or greater.  You develop worsening pain at or near the incision site. MAKE SURE YOU:   Understand these instructions.  Will watch your condition.  Will get help right away if you are not doing well or get worse. Document Released: 05/08/2006 Document Revised: 03/20/2011 Document Reviewed: 07/09/2006 [employer] Patient Information 2014 [employer], [place].   ________________________________________________________________________  WHAT IS A BLOOD TRANSFUSION? Blood Transfusion Information  A transfusion is the replacement of blood or some of its parts. Blood is made up of multiple cells which provide different functions.  Red blood cells carry oxygen and are  used for blood loss replacement.  White blood cells fight against infection.  Platelets control bleeding.  Plasma helps clot blood.  Other blood products are available for specialized needs, such as hemophilia or other clotting disorders. BEFORE THE TRANSFUSION  Who gives blood for transfusions?   Healthy volunteers who are fully evaluated to make sure their blood is safe. This is blood bank  blood. Transfusion therapy is the safest it has ever been in the practice of medicine. Before blood is taken from a donor, a complete history is taken to make sure that person has no history of diseases nor engages in risky social behavior (examples are intravenous drug use or sexual activity with multiple partners). The donor's travel history is screened to minimize risk of transmitting infections, such as malaria. The donated blood is tested for signs of infectious diseases, such as HIV and hepatitis. The blood is then tested to be sure it is compatible with you in order to minimize the chance of a transfusion reaction. If you or a relative donates blood, this is often done in anticipation of surgery and is not appropriate for emergency situations. It takes many days to process the donated blood. RISKS AND COMPLICATIONS Although transfusion therapy is very safe and saves many lives, the main dangers of transfusion include:   Getting an infectious disease.  Developing a transfusion reaction. This is an allergic reaction to something in the blood you were given. Every precaution is taken to prevent this. The decision to have a blood transfusion has been considered carefully by your caregiver before blood is given. Blood is not given unless the benefits outweigh the risks. AFTER THE TRANSFUSION  Right after receiving a blood transfusion, you will usually feel much better and more energetic. This is especially true if your red blood cells have gotten low (anemic). The transfusion raises the level of the red  blood cells which carry oxygen, and this usually causes an energy increase.  The nurse administering the transfusion will monitor you carefully for complications. HOME CARE INSTRUCTIONS  No special instructions are needed after a transfusion. You may find your energy is better. Speak with your caregiver about any limitations on activity for underlying diseases you may have. SEEK MEDICAL CARE IF:   Your condition is not improving after your transfusion.  You develop redness or irritation at the intravenous (IV) site. SEEK IMMEDIATE MEDICAL CARE IF:  Any of the following symptoms occur over the next 12 hours:  Shaking chills.  You have a temperature by mouth above 102 F (38.9 C), not controlled by medicine.  Chest, back, or muscle pain.  People around you feel you are not acting correctly or are confused.  Shortness of breath or difficulty breathing.  Dizziness and fainting.  You get a rash or develop hives.  You have a decrease in urine output.  Your urine turns a dark color or changes to pink, red, or brown. Any of the following symptoms occur over the next 10 days:  You have a temperature by mouth above 102 F (38.9 C), not controlled by medicine.  Shortness of breath.  Weakness after normal activity.  The white part of the eye turns yellow (jaundice).  You have a decrease in the amount of urine or are urinating less often.  Your urine turns a dark color or changes to pink, red, or brown. Document Released: 12/24/1999 Document Revised: 03/20/2011 Document Reviewed: 08/12/2007 [facility] Patient Information 2014 [place], [place].  _______________________________________________________________________

## 2014-05-14 ENCOUNTER — Encounter (HOSPITAL_COMMUNITY): Payer: Self-pay

## 2014-05-14 ENCOUNTER — Encounter (HOSPITAL_COMMUNITY)
Admission: RE | Admit: 2014-05-14 | Discharge: 2014-05-14 | Disposition: A | Discharge status: Home / Self Care | Payer: Medicare Other | Source: Ambulatory Visit | Attending: Orthopedic Surgery | Admitting: Orthopedic Surgery

## 2014-05-14 DIAGNOSIS — Z01818 Encounter for other preprocedural examination: Secondary | ICD-10-CM | POA: Diagnosis not present

## 2014-05-14 HISTORY — DX: Personal history of other medical treatment: Z92.89

## 2014-05-14 HISTORY — DX: Sleep disorder, unspecified: G47.9

## 2014-05-14 HISTORY — DX: Unspecified contact dermatitis, unspecified cause: L25.9

## 2014-05-14 HISTORY — DX: Hyperlipidemia, unspecified: E78.5

## 2014-05-14 LAB — URINALYSIS, ROUTINE W REFLEX MICROSCOPIC
BILIRUBIN URINE: NEGATIVE
Glucose, UA: NEGATIVE mg/dL
HGB URINE DIPSTICK: NEGATIVE
KETONES UR: NEGATIVE mg/dL
Leukocytes, UA: NEGATIVE
Nitrite: NEGATIVE
Protein, ur: NEGATIVE mg/dL
SPECIFIC GRAVITY, URINE: 1.011 (ref 1.005–1.030)
Urobilinogen, UA: 0.2 mg/dL (ref 0.0–1.0)
pH: 7 (ref 5.0–8.0)

## 2014-05-14 LAB — PROTIME-INR
INR: 0.95 (ref 0.00–1.49)
Prothrombin Time: 12.7 seconds (ref 11.6–15.2)

## 2014-05-14 LAB — SURGICAL PCR SCREEN
MRSA, PCR: NEGATIVE
STAPHYLOCOCCUS AUREUS: NEGATIVE

## 2014-05-14 LAB — APTT: APTT: 28 s (ref 24–37)

## 2014-05-21 NOTE — H&P (Signed)
TOTAL KNEE ADMISSION H&P  Patient is being admitted for left total knee arthroplasty.  Subjective:  Chief Complaint:     Left knee primary OA / pain.  HPI: Christina Crawford, 64 y.o. female, has a history of pain and functional disability in the left knee due to arthritis and has failed non-surgical conservative treatments for greater than 12 weeks to include NSAID's and/or analgesics, corticosteriod injections, use of assistive devices and activity modification.  Onset of symptoms was gradual, starting years ago with gradually worsening course since that time. The patient noted prior procedures on the knee to include  arthroplasty on the right knee(s).  Patient currently rates pain in the left knee(s) at 8 out of 10 with activity. Patient has night pain, worsening of pain with activity and weight bearing, pain that interferes with activities of daily living, pain with passive range of motion, crepitus and joint swelling.  Patient has evidence of periarticular osteophytes and joint space narrowing by imaging studies.  There is no active infection.  Risks, benefits and expectations were discussed with the patient.  Risks including but not limited to the risk of anesthesia, blood clots, nerve damage, blood vessel damage, failure of the prosthesis, infection and up to and including death.  Patient understand the risks, benefits and expectations and wishes to proceed with surgery.   PCP: Christina Pulling, MD  D/C Plans:      Home with HHPT  Post-op Meds:       No Rx given  Tranexamic Acid:      To be given - IV   Decadron:      Is to be given  FYI:     ASA post-op  Norco post-op    Patient Active Problem List   Diagnosis Date Noted  . Obese 11/12/2013  . S/P right TKA 11/11/2013   Past Medical History  Diagnosis Date  . Lymphedema     left arm   . PONV (postoperative nausea and vomiting)   . Family history of anesthesia complication     sister slow to wake up with general anesthesia   .  Hypertension   . Hypothyroidism   . GERD (gastroesophageal reflux disease)   . Headache     not often   . Arthritis   . Cancer     left breast cancer - 2011  . Hyperlipidemia   . Contact dermatitis   . History of transfusion 1981  . Difficulty sleeping     Past Surgical History  Procedure Laterality Date  . Bilateral total mastectomy with axillary lymph node dissection    . Abdominal hysterectomy    . Appendectomy    . Ganglion tumors removed from hands     . Tubal ligation    . Right knee arthroscopy     . Total knee arthroplasty Right 11/11/2013    Procedure: RIGHT TOTAL KNEE ARTHROPLASTY;  Surgeon: Christina Pole, MD;  Location: [facility] ORS;  Service: Orthopedics;  Laterality: Right;  . Tonsillectomy      No prescriptions prior to admission   Allergies  Allergen Reactions  . Sulfamethoxazole Nausea Only  . Adhesive [Tape] Rash  . Augmentin Es-600 [Amoxicillin-Pot Clavulanate] Rash  . Contrast Media [Iodinated Diagnostic Agents] Rash  . Latex Rash  . Suprax [Cefixime] Rash    History  Substance Use Topics  . Smoking status: Never Smoker   . Smokeless tobacco: Never Used  . Alcohol Use: No    No family history on file.   Review  of Systems  Constitutional: Negative.   Eyes: Negative.   Respiratory: Negative.   Cardiovascular: Negative.   Gastrointestinal: Positive for heartburn.  Genitourinary: Negative.   Musculoskeletal: Positive for joint pain.  Skin: Negative.   Neurological: Positive for headaches.  Endo/Heme/Allergies: Negative.   Psychiatric/Behavioral: The patient has insomnia.     Objective:  Physical Exam  Constitutional: She is oriented to person, place, and time. She appears well-developed and well-nourished.  HENT:  Head: Normocephalic and atraumatic.  Eyes: Pupils are equal, round, and reactive to light.  Neck: Neck supple. No JVD present. No tracheal deviation present. No thyromegaly present.  Cardiovascular: Normal rate, regular rhythm,  normal heart sounds and intact distal pulses.   Respiratory: Effort normal and breath sounds normal. No stridor. No respiratory distress. She has no wheezes.  GI: Soft. There is no tenderness. There is no guarding.  Musculoskeletal:       Left knee: She exhibits decreased range of motion, swelling and bony tenderness. She exhibits no ecchymosis, no deformity, no laceration and no erythema. Tenderness found.  Lymphadenopathy:    She has no cervical adenopathy.  Neurological: She is alert and oriented to person, place, and time.  Skin: Skin is warm and dry.  Psychiatric: She has a normal mood and affect.      Labs:  Estimated body mass index is 36.03 kg/(m^2) as calculated from the following:   Height as of 11/11/13: 5\' 4"  (1.626 m).   Weight as of 10/30/13: 95.255 kg (210 lb).   Imaging Review Plain radiographs demonstrate severe degenerative joint disease of the left knee(s). The overall alignment is mild valgus. The bone quality appears to be good for age and reported activity level.  Assessment/Plan:  End stage arthritis, left knee   The patient history, physical examination, clinical judgment of the provider and imaging studies are consistent with end stage degenerative joint disease of the left knee(s) and total knee arthroplasty is deemed medically necessary. The treatment options including medical management, injection therapy arthroscopy and arthroplasty were discussed at length. The risks and benefits of total knee arthroplasty were presented and reviewed. The risks due to aseptic loosening, infection, stiffness, patella tracking problems, thromboembolic complications and other imponderables were discussed. The patient acknowledged the explanation, agreed to proceed with the plan and consent was signed. Patient is being admitted for inpatient treatment for surgery, pain control, PT, OT, prophylactic antibiotics, VTE prophylaxis, progressive ambulation and ADL's and discharge  planning. The patient is planning to be discharged home with home health services.     Christina Pugh Kristol Almanzar   PA-C  05/21/2014, 9:40 AM

## 2014-05-26 ENCOUNTER — Encounter (HOSPITAL_COMMUNITY): Payer: Self-pay | Admitting: *Deleted

## 2014-05-26 ENCOUNTER — Inpatient Hospital Stay (HOSPITAL_COMMUNITY): Payer: Medicare Other | Admitting: Anesthesiology

## 2014-05-26 ENCOUNTER — Inpatient Hospital Stay (HOSPITAL_COMMUNITY)
Admission: RE | Admit: 2014-05-26 | Discharge: 2014-05-28 | DRG: 470 | Disposition: A | Discharge status: Home Health | Payer: Medicare Other | Source: Ambulatory Visit | Attending: Orthopedic Surgery | Admitting: Orthopedic Surgery

## 2014-05-26 ENCOUNTER — Encounter (HOSPITAL_COMMUNITY)
Admission: RE | Disposition: A | Discharge status: Home Health | Payer: Self-pay | Source: Ambulatory Visit | Attending: Orthopedic Surgery

## 2014-05-26 DIAGNOSIS — E785 Hyperlipidemia, unspecified: Secondary | ICD-10-CM | POA: Diagnosis present

## 2014-05-26 DIAGNOSIS — Z01812 Encounter for preprocedural laboratory examination: Secondary | ICD-10-CM

## 2014-05-26 DIAGNOSIS — M1712 Unilateral primary osteoarthritis, left knee: Secondary | ICD-10-CM | POA: Diagnosis not present

## 2014-05-26 DIAGNOSIS — Z96659 Presence of unspecified artificial knee joint: Secondary | ICD-10-CM

## 2014-05-26 DIAGNOSIS — E039 Hypothyroidism, unspecified: Secondary | ICD-10-CM | POA: Diagnosis present

## 2014-05-26 DIAGNOSIS — Z96651 Presence of right artificial knee joint: Secondary | ICD-10-CM | POA: Diagnosis present

## 2014-05-26 DIAGNOSIS — Z9013 Acquired absence of bilateral breasts and nipples: Secondary | ICD-10-CM | POA: Diagnosis present

## 2014-05-26 DIAGNOSIS — K219 Gastro-esophageal reflux disease without esophagitis: Secondary | ICD-10-CM | POA: Diagnosis present

## 2014-05-26 DIAGNOSIS — M25562 Pain in left knee: Secondary | ICD-10-CM | POA: Diagnosis present

## 2014-05-26 DIAGNOSIS — M659 Synovitis and tenosynovitis, unspecified: Secondary | ICD-10-CM | POA: Diagnosis present

## 2014-05-26 DIAGNOSIS — I10 Essential (primary) hypertension: Secondary | ICD-10-CM | POA: Diagnosis not present

## 2014-05-26 DIAGNOSIS — Z853 Personal history of malignant neoplasm of breast: Secondary | ICD-10-CM

## 2014-05-26 DIAGNOSIS — Z6834 Body mass index (BMI) 34.0-34.9, adult: Secondary | ICD-10-CM

## 2014-05-26 DIAGNOSIS — E669 Obesity, unspecified: Secondary | ICD-10-CM | POA: Diagnosis present

## 2014-05-26 DIAGNOSIS — Z96652 Presence of left artificial knee joint: Secondary | ICD-10-CM

## 2014-05-26 HISTORY — PX: TOTAL KNEE ARTHROPLASTY: SHX125

## 2014-05-26 SURGERY — ARTHROPLASTY, KNEE, TOTAL
Anesthesia: Spinal | Site: Knee | Laterality: Left

## 2014-05-26 MED ORDER — MAGNESIUM CITRATE PO SOLN: 1.0000 | Freq: Once | ORAL | Status: AC | PRN

## 2014-05-26 MED ORDER — FERROUS SULFATE 325 (65 FE) MG PO TABS
325.0000 mg | ORAL_TABLET | Freq: Three times a day (TID) | ORAL | Status: DC
  Administered 2014-05-28: 325 mg via ORAL
  Filled 2014-05-26 (×8): qty 1

## 2014-05-26 MED ORDER — MENTHOL 3 MG MT LOZG: 1.0000 | LOZENGE | OROMUCOSAL | Status: DC | PRN

## 2014-05-26 MED ORDER — METHOCARBAMOL 1000 MG/10ML IJ SOLN
500.0000 mg | Freq: Four times a day (QID) | INTRAVENOUS | Status: DC | PRN
  Administered 2014-05-26: 500 mg via INTRAVENOUS
  Filled 2014-05-26 (×2): qty 5

## 2014-05-26 MED ORDER — BUPIVACAINE-EPINEPHRINE (PF) 0.25% -1:200000 IJ SOLN
INTRAMUSCULAR | Status: DC | PRN
  Administered 2014-05-26: 30 mL

## 2014-05-26 MED ORDER — SODIUM CHLORIDE 0.9 % IJ SOLN
INTRAMUSCULAR | Status: DC | PRN
  Administered 2014-05-26: 29 mL

## 2014-05-26 MED ORDER — ROSUVASTATIN CALCIUM 10 MG PO TABS
10.0000 mg | ORAL_TABLET | Freq: Every day | ORAL | Status: DC
  Administered 2014-05-26 – 2014-05-27 (×2): 10 mg via ORAL
  Filled 2014-05-26 (×3): qty 1

## 2014-05-26 MED ORDER — PANTOPRAZOLE SODIUM 40 MG PO TBEC
80.0000 mg | DELAYED_RELEASE_TABLET | Freq: Every day | ORAL | Status: DC
  Administered 2014-05-27 – 2014-05-28 (×2): 80 mg via ORAL
  Filled 2014-05-26 (×4): qty 2

## 2014-05-26 MED ORDER — BISACODYL 10 MG RE SUPP: 10.0000 mg | Freq: Every day | RECTAL | Status: DC | PRN

## 2014-05-26 MED ORDER — DEXAMETHASONE SODIUM PHOSPHATE 10 MG/ML IJ SOLN
INTRAMUSCULAR | Status: AC
  Filled 2014-05-26: qty 1

## 2014-05-26 MED ORDER — CHLORHEXIDINE GLUCONATE 4 % EX LIQD: 60.0000 mL | Freq: Once | CUTANEOUS | Status: DC

## 2014-05-26 MED ORDER — FENTANYL CITRATE (PF) 100 MCG/2ML IJ SOLN
INTRAMUSCULAR | Status: DC | PRN
  Administered 2014-05-26 (×2): 50 ug via INTRAVENOUS

## 2014-05-26 MED ORDER — FENTANYL CITRATE (PF) 100 MCG/2ML IJ SOLN
INTRAMUSCULAR | Status: AC
  Filled 2014-05-26: qty 2

## 2014-05-26 MED ORDER — ASPIRIN EC 325 MG PO TBEC
325.0000 mg | DELAYED_RELEASE_TABLET | Freq: Two times a day (BID) | ORAL | Status: DC
  Administered 2014-05-27 – 2014-05-28 (×3): 325 mg via ORAL
  Filled 2014-05-26 (×5): qty 1

## 2014-05-26 MED ORDER — DOCUSATE SODIUM 100 MG PO CAPS
100.0000 mg | ORAL_CAPSULE | Freq: Two times a day (BID) | ORAL | Status: DC
Start: 2014-05-26 — End: 2014-05-28
  Administered 2014-05-26 – 2014-05-28 (×4): 100 mg via ORAL

## 2014-05-26 MED ORDER — DEXAMETHASONE SODIUM PHOSPHATE 10 MG/ML IJ SOLN
10.0000 mg | Freq: Once | INTRAMUSCULAR | Status: AC
  Administered 2014-05-26: 10 mg via INTRAVENOUS

## 2014-05-26 MED ORDER — MIDAZOLAM HCL 2 MG/2ML IJ SOLN
INTRAMUSCULAR | Status: AC
  Filled 2014-05-26: qty 2

## 2014-05-26 MED ORDER — SUMATRIPTAN SUCCINATE 50 MG PO TABS
50.0000 mg | ORAL_TABLET | ORAL | Status: DC | PRN
Start: 2014-05-26 — End: 2014-05-28
  Filled 2014-05-26: qty 1

## 2014-05-26 MED ORDER — MIDAZOLAM HCL 5 MG/5ML IJ SOLN
INTRAMUSCULAR | Status: DC | PRN
  Administered 2014-05-26 (×2): 2 mg via INTRAVENOUS

## 2014-05-26 MED ORDER — HYDROMORPHONE HCL 1 MG/ML IJ SOLN
0.5000 mg | INTRAMUSCULAR | Status: DC | PRN
  Administered 2014-05-26 – 2014-05-27 (×4): 1 mg via INTRAVENOUS
  Filled 2014-05-26 (×4): qty 1

## 2014-05-26 MED ORDER — DIPHENHYDRAMINE HCL 25 MG PO CAPS: 25.0000 mg | ORAL_CAPSULE | Freq: Four times a day (QID) | ORAL | Status: DC | PRN

## 2014-05-26 MED ORDER — METOPROLOL SUCCINATE ER 100 MG PO TB24
100.0000 mg | ORAL_TABLET | Freq: Every day | ORAL | Status: DC
  Administered 2014-05-26 – 2014-05-27 (×2): 100 mg via ORAL
  Filled 2014-05-26 (×3): qty 1

## 2014-05-26 MED ORDER — EPHEDRINE SULFATE 50 MG/ML IJ SOLN
INTRAMUSCULAR | Status: DC | PRN
  Administered 2014-05-26 (×3): 10 mg via INTRAVENOUS

## 2014-05-26 MED ORDER — VANCOMYCIN HCL IN DEXTROSE 1-5 GM/200ML-% IV SOLN
1000.0000 mg | Freq: Once | INTRAVENOUS | Status: AC
  Administered 2014-05-26: 1000 mg via INTRAVENOUS
  Filled 2014-05-26: qty 200

## 2014-05-26 MED ORDER — DEXAMETHASONE SODIUM PHOSPHATE 10 MG/ML IJ SOLN
10.0000 mg | Freq: Once | INTRAMUSCULAR | Status: AC
  Administered 2014-05-27: 10 mg via INTRAVENOUS
  Filled 2014-05-26: qty 1

## 2014-05-26 MED ORDER — PROPOFOL INFUSION 10 MG/ML OPTIME
INTRAVENOUS | Status: DC | PRN
  Administered 2014-05-26: 100 ug/kg/min via INTRAVENOUS

## 2014-05-26 MED ORDER — KETOROLAC TROMETHAMINE 30 MG/ML IJ SOLN
INTRAMUSCULAR | Status: AC
  Filled 2014-05-26: qty 1

## 2014-05-26 MED ORDER — TRANEXAMIC ACID 1000 MG/10ML IV SOLN
1000.0000 mg | Freq: Once | INTRAVENOUS | Status: DC
  Filled 2014-05-26: qty 10

## 2014-05-26 MED ORDER — HYDROCHLOROTHIAZIDE 25 MG PO TABS
25.0000 mg | ORAL_TABLET | Freq: Every day | ORAL | Status: DC
  Administered 2014-05-26 – 2014-05-27 (×2): 25 mg via ORAL
  Filled 2014-05-26 (×3): qty 1

## 2014-05-26 MED ORDER — NON FORMULARY
40.0000 mg | Freq: Every day | Status: DC
Start: 2014-05-27 — End: 2014-05-26

## 2014-05-26 MED ORDER — CEFAZOLIN SODIUM-DEXTROSE 2-3 GM-% IV SOLR: 2.0000 g | Freq: Four times a day (QID) | INTRAVENOUS | Status: DC

## 2014-05-26 MED ORDER — METHOCARBAMOL 500 MG PO TABS
500.0000 mg | ORAL_TABLET | Freq: Four times a day (QID) | ORAL | Status: DC | PRN
  Administered 2014-05-27 (×2): 500 mg via ORAL
  Filled 2014-05-26 (×2): qty 1

## 2014-05-26 MED ORDER — SODIUM CHLORIDE 0.9 % IV SOLN
INTRAVENOUS | Status: DC
  Administered 2014-05-26 – 2014-05-27 (×2): via INTRAVENOUS
  Filled 2014-05-26 (×6): qty 1000

## 2014-05-26 MED ORDER — PROPOFOL 10 MG/ML IV BOLUS
INTRAVENOUS | Status: AC
  Filled 2014-05-26: qty 20

## 2014-05-26 MED ORDER — METOCLOPRAMIDE HCL 10 MG PO TABS
5.0000 mg | ORAL_TABLET | Freq: Three times a day (TID) | ORAL | Status: DC | PRN
  Administered 2014-05-27: 10 mg via ORAL
  Filled 2014-05-26: qty 1

## 2014-05-26 MED ORDER — HYDROCODONE-ACETAMINOPHEN 7.5-325 MG PO TABS
1.0000 | ORAL_TABLET | ORAL | Status: DC
  Administered 2014-05-26: 2 via ORAL
  Administered 2014-05-26: 1 via ORAL
  Administered 2014-05-27 (×4): 2 via ORAL
  Filled 2014-05-26: qty 1
  Filled 2014-05-26 (×5): qty 2

## 2014-05-26 MED ORDER — LEVOTHYROXINE SODIUM 50 MCG PO TABS
50.0000 ug | ORAL_TABLET | Freq: Every day | ORAL | Status: DC
  Administered 2014-05-27 – 2014-05-28 (×2): 50 ug via ORAL
  Filled 2014-05-26 (×3): qty 1

## 2014-05-26 MED ORDER — ALUM & MAG HYDROXIDE-SIMETH 200-200-20 MG/5ML PO SUSP
30.0000 mL | ORAL | Status: DC | PRN
  Administered 2014-05-27: 30 mL via ORAL
  Filled 2014-05-26: qty 30

## 2014-05-26 MED ORDER — LETROZOLE 2.5 MG PO TABS
2.5000 mg | ORAL_TABLET | Freq: Every day | ORAL | Status: DC
  Administered 2014-05-26 – 2014-05-27 (×2): 2.5 mg via ORAL
  Filled 2014-05-26 (×3): qty 1

## 2014-05-26 MED ORDER — POLYETHYLENE GLYCOL 3350 17 G PO PACK
17.0000 g | PACK | Freq: Two times a day (BID) | ORAL | Status: DC
  Administered 2014-05-26 – 2014-05-28 (×4): 17 g via ORAL

## 2014-05-26 MED ORDER — CEFAZOLIN SODIUM-DEXTROSE 2-3 GM-% IV SOLR: 2.0000 g | INTRAVENOUS | Status: DC

## 2014-05-26 MED ORDER — BUPIVACAINE HCL (PF) 0.5 % IJ SOLN: INTRAMUSCULAR | Status: DC | PRN

## 2014-05-26 MED ORDER — ONDANSETRON HCL 4 MG/2ML IJ SOLN
INTRAMUSCULAR | Status: DC | PRN
  Administered 2014-05-26: 4 mg via INTRAVENOUS

## 2014-05-26 MED ORDER — EPHEDRINE SULFATE 50 MG/ML IJ SOLN
INTRAMUSCULAR | Status: AC
  Filled 2014-05-26: qty 1

## 2014-05-26 MED ORDER — UBIQUINOL 100 MG PO CAPS
1.0000 | ORAL_CAPSULE | Freq: Every day | ORAL | Status: DC
  Filled 2014-05-26 (×2): qty 1

## 2014-05-26 MED ORDER — PHENOL 1.4 % MT LIQD
1.0000 | OROMUCOSAL | Status: DC | PRN
  Filled 2014-05-26: qty 177

## 2014-05-26 MED ORDER — SODIUM CHLORIDE 0.9 % IR SOLN
Status: DC | PRN
  Administered 2014-05-26: 1000 mL

## 2014-05-26 MED ORDER — HYDROMORPHONE HCL 1 MG/ML IJ SOLN: 0.2500 mg | INTRAMUSCULAR | Status: DC | PRN

## 2014-05-26 MED ORDER — LACTATED RINGERS IV SOLN
INTRAVENOUS | Status: DC
  Administered 2014-05-26 (×3): via INTRAVENOUS

## 2014-05-26 MED ORDER — ONDANSETRON HCL 4 MG PO TABS
4.0000 mg | ORAL_TABLET | Freq: Four times a day (QID) | ORAL | Status: DC | PRN
  Administered 2014-05-27: 4 mg via ORAL
  Filled 2014-05-26: qty 1

## 2014-05-26 MED ORDER — BUPIVACAINE IN DEXTROSE 0.75-8.25 % IT SOLN
INTRATHECAL | Status: DC | PRN
  Administered 2014-05-26: 15 mg via INTRATHECAL

## 2014-05-26 MED ORDER — ONDANSETRON HCL 4 MG/2ML IJ SOLN: 4.0000 mg | Freq: Four times a day (QID) | INTRAMUSCULAR | Status: DC | PRN

## 2014-05-26 MED ORDER — TRANEXAMIC ACID 1000 MG/10ML IV SOLN
2500.0000 mg | INTRAVENOUS | Status: DC | PRN
  Administered 2014-05-26: 1000 mg via INTRAVENOUS

## 2014-05-26 MED ORDER — KETOROLAC TROMETHAMINE 30 MG/ML IJ SOLN
INTRAMUSCULAR | Status: DC | PRN
  Administered 2014-05-26: 30 mg

## 2014-05-26 MED ORDER — SODIUM CHLORIDE 0.9 % IJ SOLN
INTRAMUSCULAR | Status: AC
  Filled 2014-05-26: qty 50

## 2014-05-26 MED ORDER — METOCLOPRAMIDE HCL 5 MG/ML IJ SOLN
5.0000 mg | Freq: Three times a day (TID) | INTRAMUSCULAR | Status: DC | PRN
  Administered 2014-05-27: 10 mg via INTRAVENOUS
  Filled 2014-05-26: qty 2

## 2014-05-26 MED ORDER — ONDANSETRON HCL 4 MG/2ML IJ SOLN
INTRAMUSCULAR | Status: AC
  Filled 2014-05-26: qty 2

## 2014-05-26 MED ORDER — BUPIVACAINE-EPINEPHRINE (PF) 0.25% -1:200000 IJ SOLN
INTRAMUSCULAR | Status: AC
  Filled 2014-05-26: qty 30

## 2014-05-26 SURGICAL SUPPLY — 52 items
BAG DECANTER FOR FLEXI CONT (MISCELLANEOUS) IMPLANT
BAG ZIPLOCK 12X15 (MISCELLANEOUS) ×3 IMPLANT
BANDAGE ELASTIC 6 VELCRO ST LF (GAUZE/BANDAGES/DRESSINGS) ×3 IMPLANT
BANDAGE ESMARK 6X9 LF (GAUZE/BANDAGES/DRESSINGS) ×1 IMPLANT
BLADE SAW SGTL 13.0X1.19X90.0M (BLADE) ×3 IMPLANT
BNDG ESMARK 6X9 LF (GAUZE/BANDAGES/DRESSINGS) ×3
BOWL SMART MIX CTS (DISPOSABLE) ×3 IMPLANT
CAPT KNEE TOTAL 3 ATTUNE ×3 IMPLANT
CEMENT HV SMART SET (Cement) ×6 IMPLANT
CUFF TOURN SGL QUICK 34 (TOURNIQUET CUFF) ×2
CUFF TRNQT CYL 34X4X40X1 (TOURNIQUET CUFF) ×1 IMPLANT
DECANTER SPIKE VIAL GLASS SM (MISCELLANEOUS) ×3 IMPLANT
DRAPE EXTREMITY T 121X128X90 (DRAPE) ×3 IMPLANT
DRAPE POUCH INSTRU U-SHP 10X18 (DRAPES) ×3 IMPLANT
DRAPE U-SHAPE 47X51 STRL (DRAPES) ×3 IMPLANT
DRSG AQUACEL AG ADV 3.5X10 (GAUZE/BANDAGES/DRESSINGS) ×3 IMPLANT
DURAPREP 26ML APPLICATOR (WOUND CARE) ×6 IMPLANT
ELECT REM PT RETURN 9FT ADLT (ELECTROSURGICAL) ×3
ELECTRODE REM PT RTRN 9FT ADLT (ELECTROSURGICAL) ×1 IMPLANT
FACESHIELD WRAPAROUND (MASK) ×15 IMPLANT
GLOVE BIOGEL PI IND STRL 7.5 (GLOVE) ×1 IMPLANT
GLOVE BIOGEL PI IND STRL 8.5 (GLOVE) ×1 IMPLANT
GLOVE BIOGEL PI INDICATOR 7.5 (GLOVE) ×2
GLOVE BIOGEL PI INDICATOR 8.5 (GLOVE) ×2
GLOVE ECLIPSE 8.0 STRL XLNG CF (GLOVE) ×3 IMPLANT
GLOVE ORTHO TXT STRL SZ7.5 (GLOVE) ×6 IMPLANT
GOWN SPEC L3 XXLG W/TWL (GOWN DISPOSABLE) ×3 IMPLANT
GOWN STRL REUS W/TWL LRG LVL3 (GOWN DISPOSABLE) ×3 IMPLANT
HANDPIECE INTERPULSE COAX TIP (DISPOSABLE) ×2
KIT BASIN OR (CUSTOM PROCEDURE TRAY) ×3 IMPLANT
LIQUID BAND (GAUZE/BANDAGES/DRESSINGS) ×3 IMPLANT
MANIFOLD NEPTUNE II (INSTRUMENTS) ×3 IMPLANT
NDL SAFETY ECLIPSE 18X1.5 (NEEDLE) ×1 IMPLANT
NEEDLE HYPO 18GX1.5 SHARP (NEEDLE) ×2
PACK TOTAL JOINT (CUSTOM PROCEDURE TRAY) ×3 IMPLANT
PEN SKIN MARKING BROAD (MISCELLANEOUS) ×3 IMPLANT
POSITIONER SURGICAL ARM (MISCELLANEOUS) ×3 IMPLANT
SET HNDPC FAN SPRY TIP SCT (DISPOSABLE) ×1 IMPLANT
SET PAD KNEE POSITIONER (MISCELLANEOUS) ×3 IMPLANT
SUCTION FRAZIER 12FR DISP (SUCTIONS) ×3 IMPLANT
SUT MNCRL AB 4-0 PS2 18 (SUTURE) ×3 IMPLANT
SUT VIC AB 1 CT1 36 (SUTURE) ×3 IMPLANT
SUT VIC AB 2-0 CT1 27 (SUTURE) ×6
SUT VIC AB 2-0 CT1 TAPERPNT 27 (SUTURE) ×3 IMPLANT
SUT VLOC 180 0 24IN GS25 (SUTURE) ×3 IMPLANT
SYR 50ML LL SCALE MARK (SYRINGE) ×3 IMPLANT
TOWEL OR 17X26 10 PK STRL BLUE (TOWEL DISPOSABLE) ×3 IMPLANT
TOWEL OR NON WOVEN STRL DISP B (DISPOSABLE) ×3 IMPLANT
TRAY FOLEY W/METER SILVER 14FR (SET/KITS/TRAYS/PACK) ×3 IMPLANT
WATER STERILE IRR 1500ML POUR (IV SOLUTION) ×3 IMPLANT
WRAP KNEE MAXI GEL POST OP (GAUZE/BANDAGES/DRESSINGS) ×3 IMPLANT
YANKAUER SUCT BULB TIP 10FT TU (MISCELLANEOUS) ×3 IMPLANT

## 2014-05-26 NOTE — Plan of Care (Signed)
Problem: Consults Goal: Diagnosis- Total Joint Replacement Primary Total Knee     

## 2014-05-26 NOTE — Anesthesia Postprocedure Evaluation (Signed)
  Anesthesia Post-op Note  Patient: Christina Crawford  Procedure(s) Performed: Procedure(s) (LRB): LEFT TOTAL KNEE ARTHROPLASTY (Left)  Patient Location: PACU  Anesthesia Type: Spinal  Level of Consciousness: awake and alert   Airway and Oxygen Therapy: Patient Spontanous Breathing  Post-op Pain: mild  Post-op Assessment: Post-op Vital signs reviewed, Patient's Cardiovascular Status Stable, Respiratory Function Stable, Patent Airway and No signs of Nausea or vomiting  Last Vitals:  Filed Vitals:   05/26/14 1807  BP: 133/59  Pulse: 66  Temp: 36.6 C  Resp: 16    Post-op Vital Signs: stable   Complications: No apparent anesthesia complications. Moving both legs upon discharge from PACU

## 2014-05-26 NOTE — Transfer of Care (Signed)
Immediate Anesthesia Transfer of Care Note  Patient: Christina Crawford  Procedure(s) Performed: Procedure(s): LEFT TOTAL KNEE ARTHROPLASTY (Left)  Patient Location: PACU  Anesthesia Type:Spinal  Level of Consciousness: awake, alert  and oriented  Airway & Oxygen Therapy: Patient Spontanous Breathing and Patient connected to nasal cannula oxygen  Post-op Assessment: Report given to RN and Post -op Vital signs reviewed and stable  Post vital signs: Reviewed and stable  Last Vitals:  Filed Vitals:   05/26/14 0941  BP: 148/78  Pulse: 57  Temp: 37.3 C  Resp: 16    Complications: No apparent anesthesia complications

## 2014-05-26 NOTE — Anesthesia Procedure Notes (Signed)
Spinal Patient location during procedure: OR Start time: 05/26/2014 1:04 PM End time: 05/26/2014 1:07 PM Staffing Anesthesiologist: Franne Grip Resident/CRNA: Chandra Batch A Performed by: anesthesiologist and resident/CRNA  Preanesthetic Checklist Completed: patient identified, site marked, surgical consent, pre-op evaluation, timeout performed, IV checked, risks and benefits discussed and monitors and equipment checked Spinal Block Patient position: sitting Prep: Betadine and site prepped and draped Patient monitoring: heart rate, cardiac monitor, continuous pulse ox and blood pressure Approach: midline Location: L2-3 Injection technique: single-shot Needle Needle type: Quincke  Needle gauge: 22 G Assessment Sensory level: T10 Additional Notes 1st attempt by CRNA at L3-L4. Negative pain or parestehsia. Positive CSF flow, after positioning patient supine patient reports tingling in feet but unable to achieve acceptable surgical anesthesia level. Patient placed back to sitting position, Dr. Delma Post performed 2nd attempt. Negative paresthesia or pain noted, positive CSF flow noted. Patient repositioned post spinal placement

## 2014-05-26 NOTE — Op Note (Signed)
NAME:  Christina Crawford RECORD NO.:  [id number]                             FACILITY:  [facility]      PHYSICIAN:  Pietro Cassis. Alvan Dame, M.D.  DATE OF BIRTH:  05-21-1950      DATE OF PROCEDURE:  05/26/2014                                     OPERATIVE REPORT         PREOPERATIVE DIAGNOSIS:  Left knee osteoarthritis.      POSTOPERATIVE DIAGNOSIS:  Left knee osteoarthritis.      FINDINGS:  The patient was noted to have complete loss of cartilage and   bone-on-bone arthritis with associated osteophytes in all three compartments of   the knee with a significant synovitis and associated effusion.      PROCEDURE:  Left total knee replacement.      COMPONENTS USED:  DePuy Attune rotating platform posterior stabilized knee   system, a size 4 femur, 4 tibia, size 6 mm PS AOX insert, and 32 anatomic patellar   button.      SURGEON:  Pietro Cassis. Alvan Dame, M.D.      ASSISTANT:  Danae Orleans, PA-C.      ANESTHESIA:  Spinal.      SPECIMENS:  None.      COMPLICATION:  None.      DRAINS:  None.  EBL: <50cc      TOURNIQUET TIME:   Total Tourniquet Time Documented: Thigh (Left) - 33 minutes Total: Thigh (Left) - 33 minutes  .      The patient was stable to the recovery room.      INDICATION FOR PROCEDURE:  Christina Crawford is a 63 y.o. female patient of   mine.  The patient had been seen, evaluated, and treated conservatively in the   office with medication, activity modification, and injections.  The patient had   radiographic changes of bone-on-bone arthritis with endplate sclerosis and osteophytes noted.      The patient failed conservative measures including medication, injections, and activity modification, and at this point was ready for more definitive measures.   Based on the radiographic changes and failed conservative measures, the patient   decided to proceed with total knee replacement.  Risks of infection,   DVT, component failure, need for revision  surgery, postop course, and   expectations were all   discussed and reviewed.  Consent was obtained for benefit of pain   relief.      PROCEDURE IN DETAIL:  The patient was brought to the operative theater.   Once adequate anesthesia, preoperative antibiotics, 1gm of Vancomycin, 1gm of Vancomycin, and 10mg  of Decadron administered, the patient was positioned supine with the left thigh tourniquet placed.  The  left lower extremity was prepped and draped in sterile fashion.  A time-   out was performed identifying the patient, planned procedure, and   extremity.      The left lower extremity was placed in the [facility] leg holder.  The leg was   exsanguinated, tourniquet elevated to 250 mmHg.  A midline incision was   made followed by median parapatellar arthrotomy.  Following initial   exposure, attention was first directed to the patella.  Precut   measurement was noted to be 21 mm.  I resected down to 14 mm and used a   32 patellar button to restore patellar height as well as cover the cut   surface.      The lug holes were drilled and a metal shim was placed to protect the   patella from retractors and saw blades.      At this point, attention was now directed to the femur.  The femoral   canal was opened with a drill, irrigated to try to prevent fat emboli.  An   intramedullary rod was passed at 3 degrees valgus, 9 mm of bone was   resected off the distal femur.  Following this resection, the tibia was   subluxated anteriorly.  Using the extramedullary guide, 2 mm of bone was resected off   the proximal medial tibia.  We confirmed the gap would be   stable medially and laterally with a size 5 mm insert as well as confirmed   the cut was perpendicular in the coronal plane, checking with an alignment rod.      Once this was done, I sized the femur to be a size 4 in the anterior-   posterior dimension, chose a standard component based on medial and   lateral dimension.  The size 4  rotation block was then pinned in   position anterior referenced using the C-clamp to set rotation.  The   anterior, posterior, and  chamfer cuts were made without difficulty nor   notching making certain that I was along the anterior cortex to help   with flexion gap stability.      The final box cut was made off the lateral aspect of distal femur.      At this point, the tibia was sized to be a size 4, the size 4 tray was   then pinned in position through the medial third of the tubercle,   drilled, and keel punched.  Trial reduction was now carried with a 4 femur,  4 tibia, a size 6 mm PS insert, and the 32 patella botton.  The knee was brought to   extension, full extension with good flexion stability with the patella   tracking through the trochlea without application of pressure.  Given   all these findings, the trial components removed.  Final components were   opened and cement was mixed.  The knee was irrigated with normal saline   solution and pulse lavage.  The synovial lining was   then injected with 30cc of 0.25% Marcaine with epinephrine and 1 cc of Toradol plus 30cc of NS total of 61 cc.      The knee was irrigated.  Final implants were then cemented onto clean and   dried cut surfaces of bone with the knee brought to extension with a size 6 mm trial insert.      Once the cement had fully cured, the excess cement was removed   throughout the knee.  I confirmed I was satisfied with the range of   motion and stability, and the final size 6 mm PS AOX insert was chosen.  It was   placed into the knee.      The tourniquet had been let down at 33 minutes.  No significant   hemostasis required.  The   extensor mechanism was then reapproximated using #1 Vicryl and #  0 V-lock with the knee   in flexion.  The   remaining wound was closed with 2-0 Vicryl and running 4-0 Monocryl.   The knee was cleaned, dried, dressed sterilely using Dermabond and   Aquacel dressing.  The patient  was then   brought to recovery room in stable condition, tolerating the procedure   well.   Please note that Physician Assistant, Danae Orleans, PA-C, was present for the entirety of the case, and was utilized for pre-operative positioning, peri-operative retractor management, general facilitation of the procedure.  He was also utilized for primary wound closure at the end of the case.              Pietro Cassis Alvan Dame, M.D.    05/26/2014 2:48 PM

## 2014-05-26 NOTE — Anesthesia Preprocedure Evaluation (Addendum)
Anesthesia Evaluation  Patient identified by MRN, date of birth, ID band Patient awake    Reviewed: Allergy & Precautions, NPO status , Patient's Chart, lab work & pertinent test results  History of Anesthesia Complications (+) PONV, Family history of anesthesia reaction and history of anesthetic complications  Airway Mallampati: II  TM Distance: >3 FB Neck ROM: Full    Dental no notable dental hx.    Pulmonary neg pulmonary ROS,  breath sounds clear to auscultation  Pulmonary exam normal       Cardiovascular Exercise Tolerance: Good hypertension, Pt. on medications and Pt. on home beta blockers Normal cardiovascular examRhythm:Regular Rate:Normal     Neuro/Psych  Headaches, negative psych ROS   GI/Hepatic Neg liver ROS, GERD-  ,  Endo/Other  Hypothyroidism   Renal/GU negative Renal ROS  negative genitourinary   Musculoskeletal  (+) Arthritis -,   Abdominal   Peds negative pediatric ROS (+)  Hematology negative hematology ROS (+)   Anesthesia Other Findings   Reproductive/Obstetrics negative OB ROS                            Anesthesia Physical Anesthesia Plan  ASA: II  Anesthesia Plan: Spinal   Post-op Pain Management:    Induction: Intravenous  Airway Management Planned:   Additional Equipment:   Intra-op Plan:   Post-operative Plan:   Informed Consent: I have reviewed the patients History and Physical, chart, labs and discussed the procedure including the risks, benefits and alternatives for the proposed anesthesia with the patient or authorized representative who has indicated his/her understanding and acceptance.   Dental advisory given  Plan Discussed with: CRNA  Anesthesia Plan Comments: (Discussed risks and benefits of and differences between spinal and general. Discussed risks of spinal including headache, backache, failure, bleeding, infection, and nerve damage.  Patient consents to spinal. Questions answered. Coagulation studies and platelet count acceptable.)       Anesthesia Quick Evaluation

## 2014-05-26 NOTE — Interval H&P Note (Signed)
History and Physical Interval Note:  05/26/2014 11:24 AM  Christina Crawford  has presented today for surgery, with the diagnosis of left knee osteoarthritis  The various methods of treatment have been discussed with the patient and family. After consideration of risks, benefits and other options for treatment, the patient has consented to  Procedure(s): LEFT TOTAL KNEE ARTHROPLASTY (Left) as a surgical intervention .  The patient's history has been reviewed, patient examined, no change in status, stable for surgery.  I have reviewed the patient's chart and labs.  Questions were answered to the patient's satisfaction.     Mauri Pole

## 2014-05-27 ENCOUNTER — Encounter (HOSPITAL_COMMUNITY): Payer: Self-pay | Admitting: Orthopedic Surgery

## 2014-05-27 LAB — BASIC METABOLIC PANEL
Anion gap: 11 (ref 5–15)
BUN: 15 mg/dL (ref 6–20)
CALCIUM: 8.5 mg/dL — AB (ref 8.9–10.3)
CO2: 26 mmol/L (ref 22–32)
Chloride: 102 mmol/L (ref 101–111)
Creatinine, Ser: 0.64 mg/dL (ref 0.44–1.00)
GFR calc Af Amer: >60 mL/min (ref >60)
GFR calc non Af Amer: >60 mL/min (ref >60)
GLUCOSE: 159 mg/dL — AB (ref 65–99)
POTASSIUM: 3.5 mmol/L (ref 3.5–5.1)
Sodium: 139 mmol/L (ref 135–145)

## 2014-05-27 LAB — CBC
HCT: 37.4 % (ref 36.0–46.0)
Hemoglobin: 12.3 g/dL (ref 12.0–15.0)
MCH: 28.8 pg (ref 26.0–34.0)
MCHC: 32.9 g/dL (ref 30.0–36.0)
MCV: 87.6 fL (ref 78.0–100.0)
Platelets: 218 10*3/uL (ref 150–400)
RBC: 4.27 MIL/uL (ref 3.87–5.11)
RDW: 13.4 % (ref 11.5–15.5)
WBC: 13.5 10*3/uL — ABNORMAL HIGH (ref 4.0–10.5)

## 2014-05-27 MED ORDER — DOCUSATE SODIUM 100 MG PO CAPS: 100.0000 mg | ORAL_CAPSULE | Freq: Two times a day (BID) | ORAL | Status: DC

## 2014-05-27 MED ORDER — ASPIRIN 325 MG PO TBEC: 325.0000 mg | DELAYED_RELEASE_TABLET | Freq: Two times a day (BID) | ORAL | Status: AC

## 2014-05-27 MED ORDER — POLYETHYLENE GLYCOL 3350 17 G PO PACK: 17.0000 g | PACK | Freq: Two times a day (BID) | ORAL | Status: DC

## 2014-05-27 MED ORDER — FERROUS SULFATE 325 (65 FE) MG PO TABS: 325.0000 mg | ORAL_TABLET | Freq: Three times a day (TID) | ORAL | Status: DC

## 2014-05-27 MED ORDER — TIZANIDINE HCL 4 MG PO TABS: 4.0000 mg | ORAL_TABLET | Freq: Four times a day (QID) | ORAL | Status: DC | PRN

## 2014-05-27 MED ORDER — OXYCODONE HCL 5 MG PO TABS: 5.0000 mg | ORAL_TABLET | ORAL | Status: DC | PRN

## 2014-05-27 MED ORDER — OXYCODONE HCL 5 MG PO TABS
5.0000 mg | ORAL_TABLET | ORAL | Status: DC
  Administered 2014-05-27: 5 mg via ORAL
  Administered 2014-05-28 (×4): 10 mg via ORAL
  Filled 2014-05-27 (×3): qty 2
  Filled 2014-05-27: qty 1
  Filled 2014-05-27: qty 2

## 2014-05-27 NOTE — Progress Notes (Signed)
Occupational Therapy Evaluation Patient Details Name: Christina Crawford MRN: [id number] DOB: [birth year] Today's Date: 05/27/2014    History of Present Illness 64 yo F s/p L TKA   Clinical Impression   Patient independent>mod I PTA. Patient currently functioning at an overall supervision>min guard assist level. Patient will benefit from acute OT to increase overall independence in the areas of ADLs, functional mobility, and overall safety in order to safely discharge to sisters house with supervision/assistance provided by sister.     Follow Up Recommendations  No OT follow up;Supervision/Assistance - 24 hour    Equipment Recommendations  None recommended by OT    Recommendations for Other Services  None at this time   Precautions / Restrictions Precautions Precautions: Fall;Knee Precaution Comments: reviewed knee precautions and no pillow under knee Restrictions Weight Bearing Restrictions: Yes LLE Weight Bearing: Weight bearing as tolerated      Mobility Bed Mobility General bed mobility comments: Pt found seated in recliner upon OT entering room.  Transfers Overall transfer level: Needs assistance Equipment used: Rolling walker (2 wheeled) Transfers: Sit to/from Stand Sit to Stand: Min guard General transfer comment: Cues for technique and safety, min guard for safety    Balance Overall balance assessment: Needs assistance Sitting-balance support: No upper extremity supported;Feet supported Sitting balance-Leahy Scale: Good     Standing balance support: Bilateral upper extremity supported;During functional activity Standing balance-Leahy Scale: Fair    ADL Overall ADL's : Needs assistance/impaired General ADL Comments: Pt able to reach BLEs for LB ADLs. Patient stood with RW and close supervision, min guard needed for functional ambulation. Patient performed toilet transfer with min guard. Discussed shower stall transfer. Pt with some unsteadiness on feet, will  benefit from acute OT prior to discharge > home.     Pertinent Vitals/Pain Pain Assessment: 0-10 Pain Score: 6  Pain Location: Bilateral knees  Pain Descriptors / Indicators: Aching ("popping" in right knee) Pain Intervention(s): Monitored during session     Hand Dominance Right   Extremity/Trunk Assessment Upper Extremity Assessment Upper Extremity Assessment: Overall WFL for tasks assessed   Lower Extremity Assessment Lower Extremity Assessment: Defer to PT evaluation   Cervical / Trunk Assessment Cervical / Trunk Assessment: Normal   Communication Communication Communication: No difficulties   Cognition Arousal/Alertness: Awake/alert Behavior During Therapy: WFL for tasks assessed/performed Overall Cognitive Status: Within Functional Limits for tasks assessed              Home Living Family/patient expects to be discharged to:: Private residence (sisters house) Living Arrangements: Other relatives (sister) Available Help at Discharge: Family Type of Home: House Home Access: Level entry;Stairs to enter Entrance Stairs-Number of Steps: threshold Entrance Stairs-Rails: Right Home Layout: One level     Bathroom Shower/Tub: Walk-in shower;Curtain   Bathroom Toilet: Handicapped height     Home Equipment: Environmental consultant - 2 wheels;Cane - single point;Bedside commode   Prior Functioning/Environment Level of Independence: Independent     OT Diagnosis: Generalized weakness;Acute pain   OT Problem List: Decreased strength;Decreased range of motion;Decreased activity tolerance;Impaired balance (sitting and/or standing);Decreased safety awareness;Decreased knowledge of use of DME or AE;Decreased knowledge of precautions;Pain   OT Treatment/Interventions: Self-care/ADL training;Energy conservation;DME and/or AE instruction;Balance training;Patient/family education;Therapeutic activities    OT Goals(Current goals can be found in the care plan section) Acute Rehab OT  Goals Patient Stated Goal: go home soon OT Goal Formulation: With patient Time For Goal Achievement: 06/03/14 Potential to Achieve Goals: Good ADL Goals Pt Will Perform Grooming: standing;with modified independence Pt  Will Perform Lower Body Bathing: with modified independence;sit to/from stand Pt Will Perform Lower Body Dressing: with modified independence;sit to/from stand Pt Will Transfer to Toilet: with modified independence;bedside commode;ambulating Pt Will Perform Toileting - Clothing Manipulation and hygiene: with modified independence;sit to/from stand Pt Will Perform Tub/Shower Transfer: Shower transfer;rolling walker;3 in 1;ambulating  OT Frequency: Min 2X/week   Barriers to D/C: None known at this time   End of Session Equipment Utilized During Treatment: Rolling walker  Activity Tolerance: Patient tolerated treatment well Patient left: in chair;with call bell/phone within reach   Time: 1143-1204 OT Time Calculation (min): 21 min Charges:  OT General Charges $OT Visit: 1 Procedure OT Evaluation $Initial OT Evaluation Tier I: 1 Procedure  Justeen Hehr , MS, OTR/L, CLT Pager: [phone]  05/27/2014, 12:26 PM

## 2014-05-27 NOTE — Plan of Care (Signed)
Problem: Consults Goal: Diagnosis- Total Joint Replacement Outcome: Completed/Met Date Met:  05/27/14 Primary Total Knee LEFT

## 2014-05-27 NOTE — Care Management Note (Signed)
Case Management Note  Patient Details  Name: Christina Crawford MRN: [id number] Date of Birth: 1950/01/12  Subjective/Objective:                   LEFT TOTAL KNEE ARTHROPLASTY (Left)  Action/Plan:  Discharge planning Expected Discharge Date:  05/28/14               Expected Discharge Plan:  [place]  In-House Referral:     Discharge planning Services  CM Consult  Post Acute Care Choice:  Home Health Choice offered to:  Patient  DME Arranged:    DME Agency:     HH Arranged:  PT HH Agency:  [facility]  Status of Service:  Completed, signed off  Medicare Important Message Given:    Date Medicare IM Given:    Medicare IM give by:    Date Additional Medicare IM Given:    Additional Medicare Important Message give by:     If discussed at [place] of Stay Meetings, dates discussed:    Additional Comments: CM met with pt in room to offer choice of home health agency.  Pt chooses [facility] to render HHPT.  Pt had surgery 6 mos ago and has both a 3n1 and rolling walker.  Pt will be recuperating at [place] [address].  Referral emailed to [place], Tim with appropriate address.  No other CM needs were communicated. Dellie Catholic, RN 05/27/2014, 12:20 PM

## 2014-05-27 NOTE — Discharge Instructions (Signed)

## 2014-05-27 NOTE — Evaluation (Signed)
Physical Therapy Evaluation Patient Details Name: Christina Crawford MRN: [id number] DOB: 03/23/1950 Today's Date: 05/27/2014   History of Present Illness  64 yo F s/p L TKA  Clinical Impression  Pt s/p L TKR presents with decreased L LE strength/ROM and post op pain limiting functional mobility.  Pt should progress to dc home with assist of family and HHPT follow up.    Follow Up Recommendations Home health PT    Equipment Recommendations  None recommended by PT    Recommendations for Other Services OT consult     Precautions / Restrictions Precautions Precautions: Fall;Knee Precaution Comments: reviewed knee precautions and no pillow under knee Restrictions Weight Bearing Restrictions: Yes LLE Weight Bearing: Weight bearing as tolerated      Mobility  Bed Mobility Overal bed mobility: Needs Assistance Bed Mobility: Supine to Sit     Supine to sit: Min assist     General bed mobility comments: Pt found seated in recliner upon OT entering room.  Transfers Overall transfer level: Needs assistance Equipment used: Rolling walker (2 wheeled) Transfers: Sit to/from Stand Sit to Stand: Min guard         General transfer comment: Cues for technique and safety, min guard for safety  Ambulation/Gait Ambulation/Gait assistance: Min assist Ambulation Distance (Feet): 38 Feet Assistive device: Rolling walker (2 wheeled) Gait Pattern/deviations: Step-to pattern;Decreased step length - right;Decreased step length - left;Shuffle;Trunk flexed     General Gait Details: cues for posture, position from RW and sequence  Stairs            Wheelchair Mobility    Modified Rankin (Stroke Patients Only)       Balance Overall balance assessment: Needs assistance Sitting-balance support: No upper extremity supported;Feet supported Sitting balance-Leahy Scale: Good     Standing balance support: Bilateral upper extremity supported;During functional activity Standing  balance-Leahy Scale: Fair                               Pertinent Vitals/Pain Pain Assessment: 0-10 Pain Score: 6  Pain Location: Bilateral knees  Pain Descriptors / Indicators: Aching ("popping" in right knee) Pain Intervention(s): Monitored during session    Home Living Family/patient expects to be discharged to:: Private residence (sisters house) Living Arrangements: Other relatives (sister) Available Help at Discharge: Family Type of Home: House Home Access: Level entry;Stairs to enter Entrance Stairs-Rails: Right Entrance Stairs-Number of Steps: threshold Home Layout: One level Home Equipment: Environmental consultant - 2 wheels;Cane - single point;Bedside commode Additional Comments: Pt will be staying with sister    Prior Function Level of Independence: Independent               Hand Dominance   Dominant Hand: Right    Extremity/Trunk Assessment   Upper Extremity Assessment: Overall WFL for tasks assessed           Lower Extremity Assessment: Defer to PT evaluation   LLE Deficits / Details: 2+/5 quads with AAROM at knee -10- 60  Cervical / Trunk Assessment: Normal  Communication   Communication: No difficulties  Cognition Arousal/Alertness: Awake/alert Behavior During Therapy: WFL for tasks assessed/performed Overall Cognitive Status: Within Functional Limits for tasks assessed                      General Comments      Exercises Total Joint Exercises Ankle Circles/Pumps: AROM;Both;15 reps;Supine Quad Sets: AROM;Both;10 reps;Supine Heel Slides: AAROM;Left;10 reps;Supine Straight Leg Raises:  AAROM;Left;10 reps;Supine      Assessment/Plan    PT Assessment Patient needs continued PT services  PT Diagnosis Difficulty walking   PT Problem List Decreased strength;Decreased range of motion;Decreased activity tolerance;Decreased mobility;Decreased knowledge of use of DME;Pain  PT Treatment Interventions DME instruction;Gait training;Stair  training;Functional mobility training;Therapeutic activities;Therapeutic exercise;Patient/family education   PT Goals (Current goals can be found in the Care Plan section) Acute Rehab PT Goals Patient Stated Goal: go home soon PT Goal Formulation: With patient Time For Goal Achievement: 06/01/14 Potential to Achieve Goals: Good    Frequency 7X/week   Barriers to discharge        Co-evaluation               End of Session Equipment Utilized During Treatment: Gait belt Activity Tolerance: Patient tolerated treatment well Patient left: in chair;with call bell/phone within reach Nurse Communication: Mobility status         Time: 1102-1127 PT Time Calculation (min) (ACUTE ONLY): 25 min   Charges:   PT Evaluation $Initial PT Evaluation Tier I: 1 Procedure PT Treatments $Therapeutic Exercise: 8-22 mins   PT G Codes:        Christina Crawford 06-07-2014, 12:54 PM

## 2014-05-27 NOTE — Progress Notes (Signed)
     Subjective: 1 Day Post-Op Procedure(s) (LRB): LEFT TOTAL KNEE ARTHROPLASTY (Left)   Patient reports pain as mild, pain controlled. No events throughout the night. Stated that she started having some significant nausea this AM.   Objective:   VITALS:   Filed Vitals:   05/27/14 0605  BP: 135/69  Pulse: 75  Temp: 98.2 F (36.8 C)  Resp: 16    Dorsiflexion/Plantar flexion intact Incision: dressing C/D/I No cellulitis present Compartment soft  LABS  Recent Labs  05/27/14 0420  HGB 12.3  HCT 37.4  WBC 13.5*  PLT 218     Recent Labs  05/27/14 0420  NA 139  K 3.5  BUN 15  CREATININE 0.64  GLUCOSE 159*     Assessment/Plan: 1 Day Post-Op Procedure(s) (LRB): LEFT TOTAL KNEE ARTHROPLASTY (Left) Foley cath d/c'ed Advance diet Up with therapy D/C IV fluids Discharge home with home health eventually, when ready  Obese (BMI 30-39.9) Estimated body mass index is 34.48 kg/(m^2) as calculated from the following:   Height as of this encounter: 5\' 4"  (1.626 m).   Weight as of this encounter: 91.173 kg (201 lb). Patient also counseled that weight may inhibit the healing process Patient counseled that losing weight will help with future health issues     [place] Pugh. Katalia Choma   PAC  05/27/2014, 9:18 AM

## 2014-05-27 NOTE — Progress Notes (Signed)
Physical Therapy Treatment Patient Details Name: Christina Crawford MRN: [id number] DOB: 1950-12-21 Today's Date: 05/27/2014    History of Present Illness 63 yo F s/p L TKA    PT Comments    Pt motivated and hopeful for dc tomorrow.  Follow Up Recommendations  Home health PT     Equipment Recommendations  None recommended by PT    Recommendations for Other Services OT consult     Precautions / Restrictions Precautions Precautions: Fall;Knee Precaution Comments: reviewed knee precautions and no pillow under knee Required Braces or Orthoses: Knee Immobilizer - Left Knee Immobilizer - Left: Discontinue once straight leg raise with < 10 degree lag Restrictions Weight Bearing Restrictions: No LLE Weight Bearing: Weight bearing as tolerated    Mobility  Bed Mobility Overal bed mobility: Needs Assistance Bed Mobility: Supine to Sit;Sit to Supine     Supine to sit: Min guard Sit to supine: Min guard   General bed mobility comments: Pt self assisting L LE with R LE  Transfers Overall transfer level: Needs assistance Equipment used: Rolling walker (2 wheeled) Transfers: Sit to/from Stand Sit to Stand: Min guard         General transfer comment: Cues for technique and safety, min guard for safety  Ambulation/Gait Ambulation/Gait assistance: Min assist;Min guard Ambulation Distance (Feet): 70 Feet (and 15' from bathroom) Assistive device: Rolling walker (2 wheeled) Gait Pattern/deviations: Step-to pattern;Decreased step length - right;Decreased step length - left;Shuffle;Trunk flexed     General Gait Details: cues for posture, position from RW and sequence   Stairs            Wheelchair Mobility    Modified Rankin (Stroke Patients Only)       Balance     Sitting balance-Leahy Scale: Good       Standing balance-Leahy Scale: Fair                      Cognition Arousal/Alertness: Awake/alert Behavior During Therapy: WFL for tasks  assessed/performed Overall Cognitive Status: Within Functional Limits for tasks assessed                      Exercises Total Joint Exercises Ankle Circles/Pumps: AROM;Both;15 reps;Supine Quad Sets: AROM;Both;10 reps;Supine Heel Slides: AAROM;Left;10 reps;Supine Straight Leg Raises: AAROM;Left;10 reps;Supine    General Comments        Pertinent Vitals/Pain Pain Assessment: 0-10 Pain Score: 6  Pain Location: L knee Pain Descriptors / Indicators: Aching;Sore Pain Intervention(s): Limited activity within patient's tolerance;Monitored during session;Premedicated before session;Ice applied    Home Living                      Prior Function            PT Goals (current goals can now be found in the care plan section) Acute Rehab PT Goals Patient Stated Goal: go home soon PT Goal Formulation: With patient Time For Goal Achievement: 06/01/14 Potential to Achieve Goals: Good Progress towards PT goals: Progressing toward goals    Frequency  7X/week    PT Plan Current plan remains appropriate    Co-evaluation             End of Session Equipment Utilized During Treatment: Gait belt;Left knee immobilizer Activity Tolerance: Patient tolerated treatment well Patient left: in bed;with call bell/phone within reach     Time: 1540-1603 PT Time Calculation (min) (ACUTE ONLY): 23 min  Charges:  $Gait Training: 8-22 mins $  Therapeutic Exercise: 8-22 mins                    G Codes:      Christina Crawford 06-13-2014, 5:01 PM

## 2014-05-28 LAB — BASIC METABOLIC PANEL
Anion gap: 11 (ref 5–15)
BUN: 12 mg/dL (ref 6–20)
CALCIUM: 8.7 mg/dL — AB (ref 8.9–10.3)
CO2: 29 mmol/L (ref 22–32)
Chloride: 100 mmol/L — ABNORMAL LOW (ref 101–111)
Creatinine, Ser: 0.62 mg/dL (ref 0.44–1.00)
GLUCOSE: 126 mg/dL — AB (ref 65–99)
Potassium: 3.6 mmol/L (ref 3.5–5.1)
SODIUM: 140 mmol/L (ref 135–145)

## 2014-05-28 LAB — CBC
HCT: 36.8 % (ref 36.0–46.0)
Hemoglobin: 11.7 g/dL — ABNORMAL LOW (ref 12.0–15.0)
MCH: 27.9 pg (ref 26.0–34.0)
MCHC: 31.8 g/dL (ref 30.0–36.0)
MCV: 87.8 fL (ref 78.0–100.0)
PLATELETS: 239 10*3/uL (ref 150–400)
RBC: 4.19 MIL/uL (ref 3.87–5.11)
RDW: 13.5 % (ref 11.5–15.5)
WBC: 14.2 10*3/uL — AB (ref 4.0–10.5)

## 2014-05-28 NOTE — Progress Notes (Signed)
     Subjective: 2 Days Post-Op Procedure(s) (LRB): LEFT TOTAL KNEE ARTHROPLASTY (Left)   Patient reports pain as mild, pain controlled. Change of medication to Oxycodone has help control her pain better. No events throughout the night. Ready to be discharged home.  Objective:   VITALS:   Filed Vitals:   05/28/14 0451  BP: 129/64  Pulse: 67  Temp: 98.9 F (37.2 C)  Resp: 16    Dorsiflexion/Plantar flexion intact Incision: dressing C/D/I No cellulitis present Compartment soft  LABS  Recent Labs  05/27/14 0420 05/28/14 0449  HGB 12.3 11.7*  HCT 37.4 36.8  WBC 13.5* 14.2*  PLT 218 239     Recent Labs  05/27/14 0420 05/28/14 0449  NA 139 140  K 3.5 3.6  BUN 15 12  CREATININE 0.64 0.62  GLUCOSE 159* 126*     Assessment/Plan: 2 Days Post-Op Procedure(s) (LRB): LEFT TOTAL KNEE ARTHROPLASTY (Left) Up with therapy Discharge home with home health  Follow up in 2 weeks at [facility]. Follow up with OLIN,Marks Scalera D in 2 weeks.  Contact information:  [facility] [address], [address] [place] Christina Crawford   PAC  05/28/2014, 7:15 AM

## 2014-05-28 NOTE — Progress Notes (Signed)
Physical Therapy Treatment Patient Details Name: Christina Crawford MRN: [id number] DOB: June 29, 1950 Today's Date: 05/28/2014    History of Present Illness 64 yo F s/p L TKA    PT Comments    POD # 2 pt OOB in recliner.  Assisted with amb to BR then in hallway.  Practiced going up/down 2 steps 2 rails requiring 50% VC's due to inconsistent understanding.  Returned to room and performed all supine TKR TE's following HEP handout.  Extra time was needed to educate pt on proper tech and freq as she stated [facility] PT was only going to be X2/wk.   Completed session with ICE L knee.  Follow Up Recommendations  Home health PT     Equipment Recommendations  None recommended by PT    Recommendations for Other Services       Precautions / Restrictions Precautions Precautions: Fall;Knee Precaution Comments: instructed on proper use of KI for stairs and amb Restrictions Weight Bearing Restrictions: No LLE Weight Bearing: Weight bearing as tolerated    Mobility  Bed Mobility               General bed mobility comments: Pt OOB in recliner  Transfers Overall transfer level: Needs assistance Equipment used: Rolling walker (2 wheeled) Transfers: Sit to/from Stand Sit to Stand: Supervision         General transfer comment: good safety cognition and use of hands to steady self  Ambulation/Gait Ambulation/Gait assistance: Supervision;Min guard Ambulation Distance (Feet): 84 Feet Assistive device: Rolling walker (2 wheeled) Gait Pattern/deviations: Step-to pattern;Decreased stance time - left Gait velocity: decreased   General Gait Details: one VC safety with turns and negociating around obsticles   Stairs Stairs: Yes Stairs assistance: Min guard Stair Management: Two rails;Forwards   General stair comments: pt required 50% VC's on proper sequencing and walker placement.    Wheelchair Mobility    Modified Rankin (Stroke Patients Only)       Balance                                     Cognition Arousal/Alertness: Awake/alert Behavior During Therapy: WFL for tasks assessed/performed Overall Cognitive Status: Within Functional Limits for tasks assessed                      Exercises   Total Knee Replacement TE's 10 reps B LE ankle pumps 10 reps towel squeezes 10 reps knee presses 10 reps heel slides  10 reps SAQ's 10 reps SLR's 10 reps ABD Followed by ICE     General Comments        Pertinent Vitals/Pain Pain Assessment: 0-10 Pain Score: 5  Pain Location: L knee Pain Intervention(s): Monitored during session;Repositioned;Ice applied    Home Living                      Prior Function            PT Goals (current goals can now be found in the care plan section) Progress towards PT goals: Progressing toward goals    Frequency  7X/week    PT Plan      Co-evaluation             End of Session Equipment Utilized During Treatment: Gait belt;Left knee immobilizer Activity Tolerance: Patient tolerated treatment well Patient left: in chair;with call bell/phone within reach     Time:  5456-2563 PT Time Calculation (min) (ACUTE ONLY): 45 min  Charges:  $Gait Training: 8-22 mins $Therapeutic Exercise: 8-22 mins $Therapeutic Activity: 8-22 mins                    G Codes:      Rica Koyanagi  PTA [facility]  Acute  Rehab Pager      [phone]

## 2014-05-28 NOTE — Progress Notes (Signed)
Occupational Therapy Treatment Patient Details Name: Christina Crawford MRN: [id number] DOB: [birth year] Today's Date: 05/28/2014    History of present illness 64 yo F s/p L TKA   OT comments  Patient progressing nicely towards OT goals, continue plan of care for now. Patient overall supervision for ADL tasks, min verbal cues needed during ADL this am, while patient performing in sit<>stand position. Pt eager to discharge > sisters house today.    Follow Up Recommendations  No OT follow up;Supervision - Intermittent    Equipment Recommendations  None recommended by OT    Recommendations for Other Services  None at this time   Precautions / Restrictions Precautions Precautions: Fall;Knee Precaution Comments: reviewed knee precautions and no pillow under knee Restrictions Weight Bearing Restrictions: No LLE Weight Bearing: Weight bearing as tolerated       Mobility Bed Mobility Overal bed mobility: Modified Independent General bed mobility comments: Pt continues to self assist LLE with RLE. No cues needed. No use of bed rails and HOB flat.   Transfers Overall transfer level: Needs assistance Equipment used: Rolling walker (2 wheeled) Transfers: Sit to/from Stand Sit to Stand: Supervision General transfer comment: Pt safely powered up from bed with one hand on bed and one hand on RW. No unsteadiness. Distant supervision for safety.     Balance Overall balance assessment: Needs assistance Sitting-balance support: No upper extremity supported;Feet supported Sitting balance-Leahy Scale: Good     Standing balance support: Bilateral upper extremity supported;During functional activity Standing balance-Leahy Scale: Good   ADL Overall ADL's : Needs assistance/impaired General ADL Comments: Pt overall supervision for ADLs. Pt performed toilet transfer using RW and B/D tasks in sit<>stand position at sink (mostly standing position). No KI used during this session, no buckling of L  knee noted. Pt safer today than yesterday. Believe she can safely transition home with intermittent supervision from sister.      Cognition Behavior During Therapy: WFL for tasks assessed/performed Overall Cognitive Status: Within Functional Limits for tasks assessed                 Pertinent Vitals/ Pain       Pain Assessment: 0-10 Pain Score: 5  Pain Location: L knee Pain Descriptors / Indicators: Sore Pain Intervention(s): Monitored during session;Repositioned;Ice applied         Frequency Min 2X/week     Progress Toward Goals  OT Goals(current goals can now be found in the care plan section)  Progress towards OT goals: Progressing toward goals     Plan Discharge plan remains appropriate    End of Session Equipment Utilized During Treatment: Rolling walker   Activity Tolerance Patient tolerated treatment well   Patient Left in chair;with call bell/phone within reach    Time: 0829-0907 OT Time Calculation (min): 38 min  Charges: OT General Charges $OT Visit: 1 Procedure OT Treatments $Self Care/Home Management : 38-52 mins  Rasheen Bells , MS, OTR/L, CLT Pager: [phone]  05/28/2014, 9:10 AM

## 2014-05-30 LAB — TYPE AND SCREEN
ABO/RH(D): A POS
ANTIBODY SCREEN: POSITIVE
DAT, IgG: NEGATIVE
DONOR AG TYPE: NEGATIVE
Donor AG Type: NEGATIVE
PT AG TYPE: NEGATIVE
Unit division: 0
Unit division: 0

## 2014-06-03 NOTE — Discharge Summary (Signed)
Physician Discharge Summary  Patient ID: Christina Crawford MRN: [id number] DOB/AGE: [birth year] 64 y.o.  Admit date: 05/26/2014 Discharge date: 05/28/2014   Procedures:  Procedure(s) (LRB): LEFT TOTAL KNEE ARTHROPLASTY (Left)  Attending Physician:  Dr. Paralee Cancel   Admission Diagnoses:   Left knee primary OA / pain  Discharge Diagnoses:  Principal Problem:   S/P left TKA Active Problems:   Obese   S/P knee replacement  Past Medical History  Diagnosis Date  . Lymphedema     left arm   . PONV (postoperative nausea and vomiting)   . Family history of anesthesia complication     sister slow to wake up with general anesthesia   . Hypertension   . Hypothyroidism   . GERD (gastroesophageal reflux disease)   . Headache     not often   . Arthritis   . Cancer     left breast cancer - 2011  . Hyperlipidemia   . Contact dermatitis   . History of transfusion 1981  . Difficulty sleeping     HPI:    Christina Crawford, 64 y.o. female, has a history of pain and functional disability in the left knee due to arthritis and has failed non-surgical conservative treatments for greater than 12 weeks to include NSAID's and/or analgesics, corticosteriod injections, use of assistive devices and activity modification. Onset of symptoms was gradual, starting years ago with gradually worsening course since that time. The patient noted prior procedures on the knee to include arthroplasty on the right knee(s). Patient currently rates pain in the left knee(s) at 8 out of 10 with activity. Patient has night pain, worsening of pain with activity and weight bearing, pain that interferes with activities of daily living, pain with passive range of motion, crepitus and joint swelling. Patient has evidence of periarticular osteophytes and joint space narrowing by imaging studies. There is no active infection. Risks, benefits and expectations were discussed with the patient. Risks including but not limited  to the risk of anesthesia, blood clots, nerve damage, blood vessel damage, failure of the prosthesis, infection and up to and including death. Patient understand the risks, benefits and expectations and wishes to proceed with surgery.   PCP: Redmond Pulling, MD   Discharged Condition: good  Hospital Course:  Patient underwent the above stated procedure on 05/26/2014. Patient tolerated the procedure well and brought to the recovery room in good condition and subsequently to the floor.  POD #1 BP: 135/69 ; Pulse: 75 ; Temp: 98.2 F (36.8 C) ; Resp: 16 Patient reports pain as mild, pain controlled. No events throughout the night. Stated that she started having some significant nausea this AM.  Dorsiflexion/plantar flexion intact, incision: dressing C/D/I, no cellulitis present and compartment soft.   LABS  Basename    HGB  12.3  HCT  37.4   POD #2  BP: 129/64 ; Pulse: 67 ; Temp: 98.9 F (37.2 C) ; Resp: 16 Patient reports pain as mild, pain controlled. Change of medication to Oxycodone has help control her pain better. No events throughout the night. Ready to be discharged home. Dorsiflexion/plantar flexion intact, incision: dressing C/D/I, no cellulitis present and compartment soft.   LABS  Basename    HGB  11.7  HCT  36.8    Discharge Exam: General appearance: alert, cooperative and no distress Extremities: Homans sign is negative, no sign of DVT, no edema, redness or tenderness in the calves or thighs and no ulcers, gangrene or trophic changes  Disposition: Home  with follow up in 2 weeks   Follow-up Information    Follow up with [facility].   Why:  home health physical therapy   Contact information:   [address] [place] [address] [phone]       Follow up with Mauri Pole, MD. Schedule an appointment as soon as possible for a visit in 2 weeks.   Specialty:  Orthopedic Surgery   Contact information:   [address] [place] [address] [phone]       Discharge Instructions    Call MD / Call 911    Complete by:  As directed   If you experience chest pain or shortness of breath, CALL 911 and be transported to the hospital emergency room.  If you develope a fever above 101 F, pus (white drainage) or increased drainage or redness at the wound, or calf pain, call your surgeon's office.     Change dressing    Complete by:  As directed   Maintain surgical dressing until follow up in the clinic. If the edges start to pull up, may reinforce with tape. If the dressing is no longer working, may remove and cover with gauze and tape, but must keep the area dry and clean.  Call with any questions or concerns.     Constipation Prevention    Complete by:  As directed   Drink plenty of fluids.  Prune juice may be helpful.  You may use a stool softener, such as Colace (over the counter) 100 mg twice a day.  Use MiraLax (over the counter) for constipation as needed.     Diet - low sodium heart healthy    Complete by:  As directed      Discharge instructions    Complete by:  As directed   Maintain surgical dressing until follow up in the clinic. If the edges start to pull up, may reinforce with tape. If the dressing is no longer working, may remove and cover with gauze and tape, but must keep the area dry and clean.  Follow up in 2 weeks at [facility]. Call with any questions or concerns.     Increase activity slowly as tolerated    Complete by:  As directed      TED hose    Complete by:  As directed   Use stockings (TED hose) for 2 weeks on both leg(s).  You may remove them at night for sleeping.     Weight bearing as tolerated    Complete by:  As directed   Laterality:  left  Extremity:  Lower             Medication List    STOP taking these medications        hydrocodone-acetaminophen 5-500 MG per capsule  Commonly known as:  LORCET-HD     HYDROcodone-acetaminophen 7.5-325 MG per  tablet  Commonly known as:  NORCO      TAKE these medications        aspirin 325 MG EC tablet  Take 1 tablet (325 mg total) by mouth 2 (two) times daily.     docusate sodium 100 MG capsule  Commonly known as:  COLACE  Take 1 capsule (100 mg total) by mouth 2 (two) times daily.     ferrous sulfate 325 (65 FE) MG tablet  Take 1 tablet (325 mg total) by mouth 3 (three) times daily after meals.     folic acid  1 MG tablet  Commonly known as:  FOLVITE  Take 1 mg by mouth at bedtime.     hydrochlorothiazide 25 MG tablet  Commonly known as:  HYDRODIURIL  Take 25 mg by mouth at bedtime.     letrozole 2.5 MG tablet  Commonly known as:  FEMARA  Take 2.5 mg by mouth at bedtime.     levothyroxine 50 MCG tablet  Commonly known as:  SYNTHROID, LEVOTHROID  Take 50 mcg by mouth daily before breakfast.     Magnesium 250 MG Tabs  Take 2 tablets by mouth every morning.     metoprolol succinate 100 MG 24 hr tablet  Commonly known as:  TOPROL-XL  Take 100 mg by mouth at bedtime.     omeprazole 40 MG capsule  Commonly known as:  PRILOSEC  Take 20 mg by mouth every morning.     OVER THE COUNTER MEDICATION  Take 2 tablets by mouth daily.     oxyCODONE 5 MG immediate release tablet  Commonly known as:  Oxy IR/ROXICODONE  Take 1-3 tablets (5-15 mg total) by mouth every 4 (four) hours as needed for severe pain.     polyethylene glycol packet  Commonly known as:  MIRALAX / GLYCOLAX  Take 17 g by mouth 2 (two) times daily.     promethazine 12.5 MG tablet  Commonly known as:  PHENERGAN  Take 1 tablet (12.5 mg total) by mouth every 6 (six) hours as needed for nausea or vomiting.     rosuvastatin 10 MG tablet  Commonly known as:  CRESTOR  Take 10 mg by mouth at bedtime.     SUMAtriptan 50 MG tablet  Commonly known as:  IMITREX  Take 50 mg by mouth every 2 (two) hours as needed for migraine or headache. May repeat in 2 hours if headache persists or recurs.     tiZANidine 4 MG tablet    Commonly known as:  ZANAFLEX  Take 1 tablet (4 mg total) by mouth every 6 (six) hours as needed for muscle spasms.     Ubiquinol 100 MG Caps  Take 1 capsule by mouth daily.         Signed: West Pugh. Naelle Diegel   PA-C  06/03/2014, 8:23 AM

## 2016-06-06 IMAGING — CR DG CHEST 2V
2 series · 2 of 2 positions shown · non-contrast
Comparison: None.

CLINICAL DATA: Hypertension. Preop for surgery 11/11/2013. Left
breast cancer 6455.

EXAM:
CHEST  2 VIEW

[w chest pa]
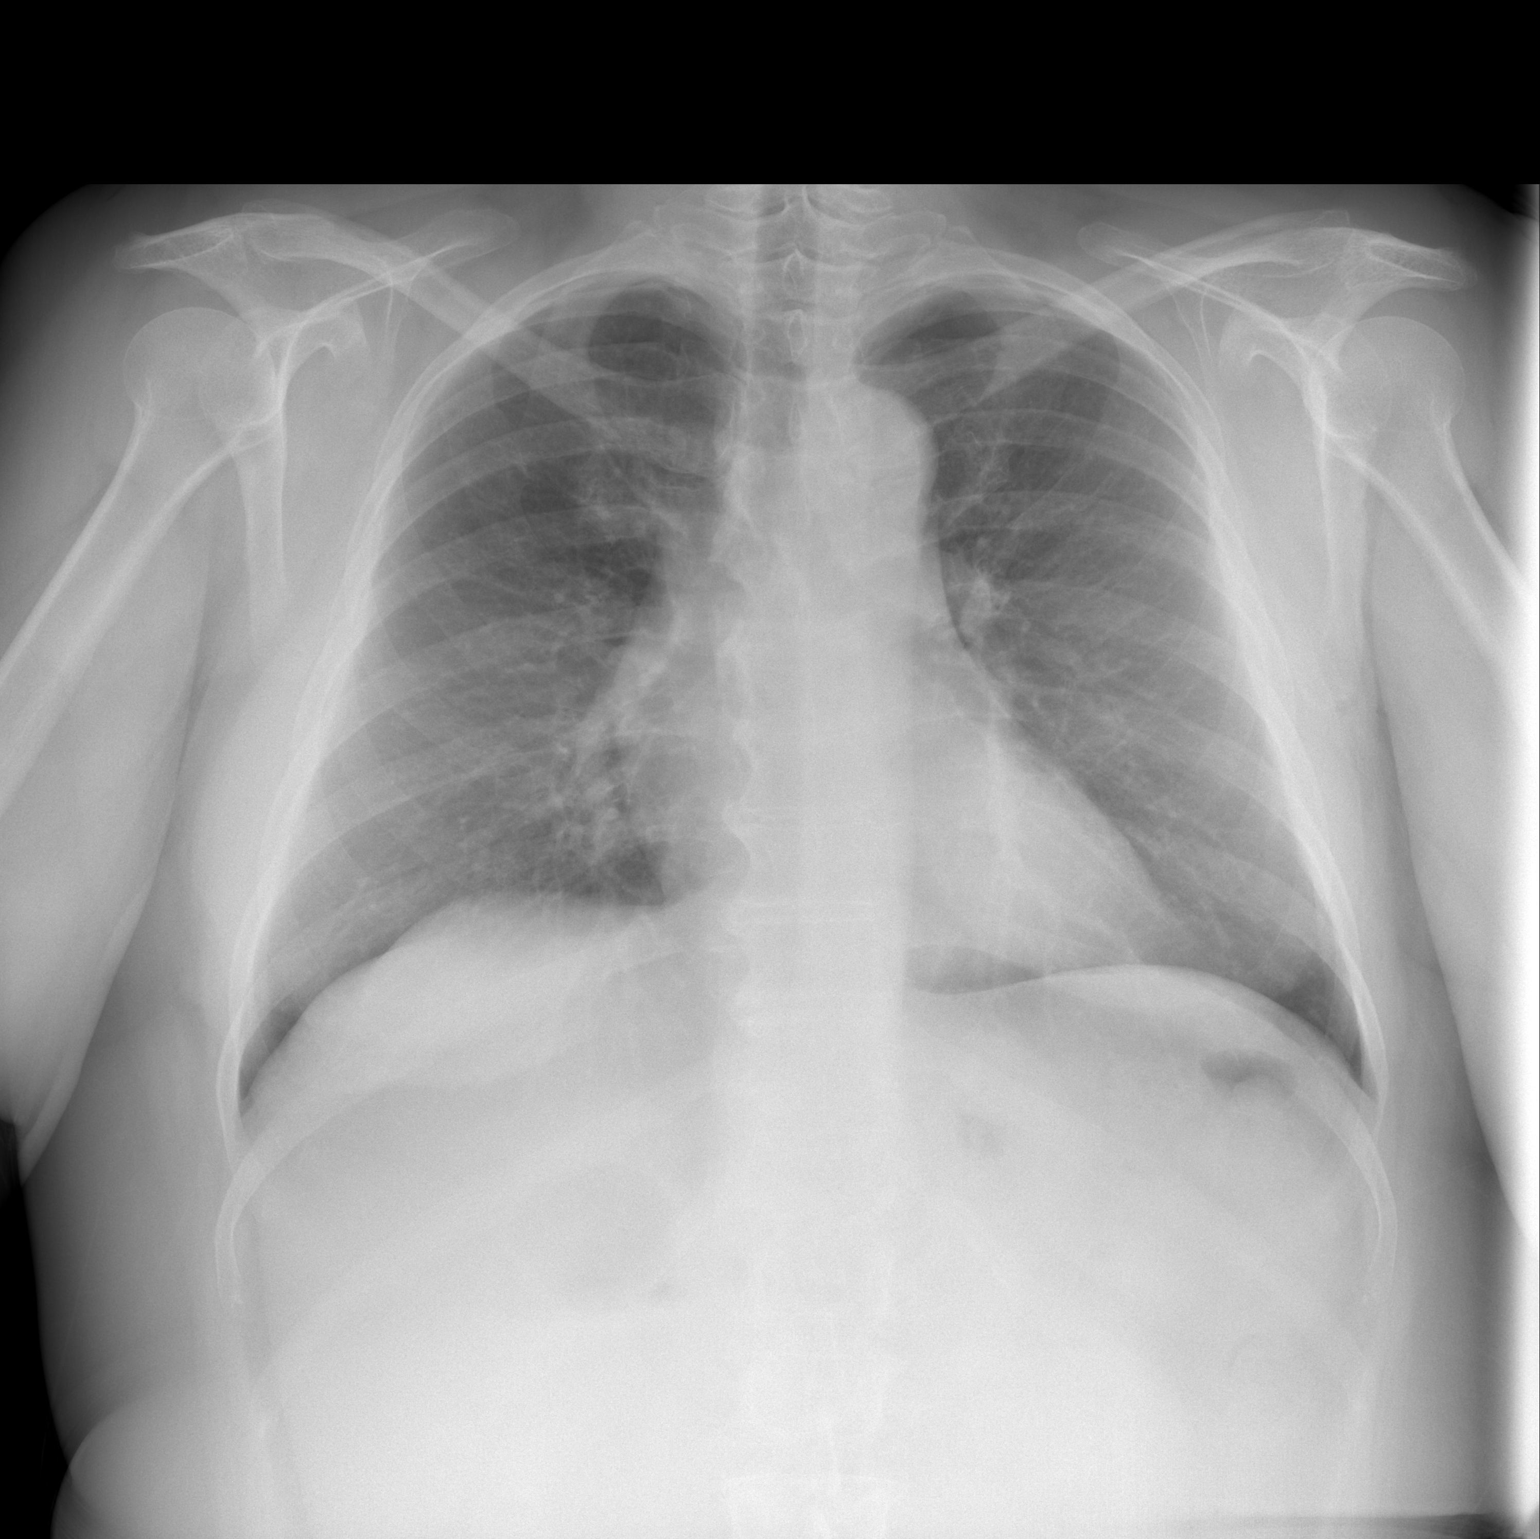

[w chest lat]
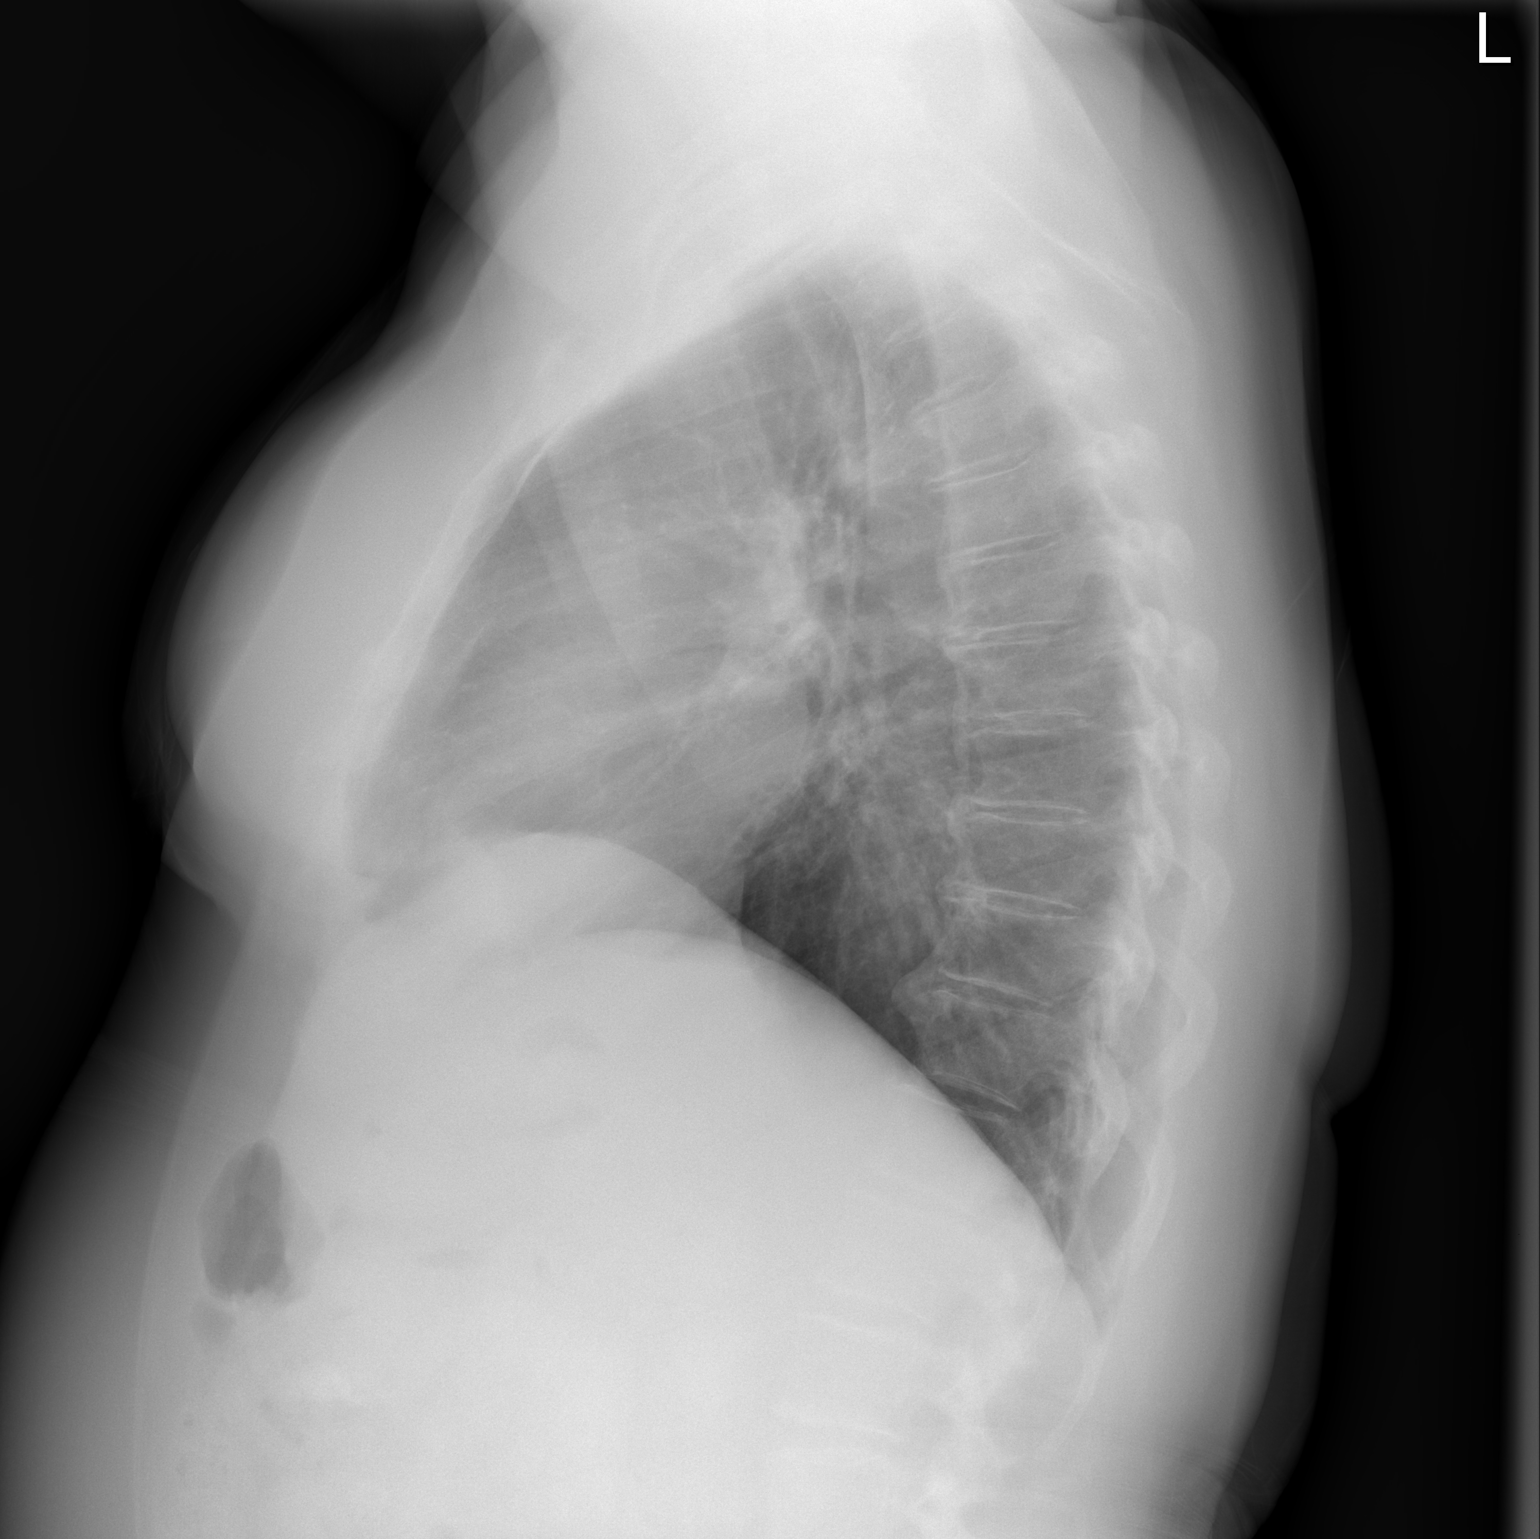

[2 of 2 positions shown; findings below may reference images not displayed]

FINDINGS: Lungs are hypoinflated without consolidation or effusion. The
cardiomediastinal silhouette is within normal. There is mild
spondylosis of the spine.
IMPRESSION: No active cardiopulmonary disease.

## 2023-07-03 NOTE — Patient Instructions (Signed)
 SURGICAL WAITING ROOM VISITATION  Patients having surgery or a procedure may have no more than 2 support people in the waiting area - these visitors may rotate.    Children under the age of 16 must have an adult with them who is not the patient.  Visitors with respiratory illnesses are discouraged from visiting and should remain at home.  If the patient needs to stay at the hospital during part of their recovery, the visitor guidelines for inpatient rooms apply. Pre-op nurse will coordinate an appropriate time for 1 support person to accompany patient in pre-op.  This support person may not rotate.    Please refer to the [facility] website for the visitor guidelines for Inpatients (after your surgery is over and you are in a regular room).       Your procedure is scheduled on: 07/17/23   Report to [facility] Main Entrance    Report to admitting at : 6:10 AM   Call this number if you have problems the morning of surgery [phone]   Do not eat food :After Midnight.   After Midnight you may have the following liquids until : 5:40 AM DAY OF SURGERY  Water Non-Citrus Juices (without pulp, NO RED-Apple, White grape, White cranberry) Black Coffee (NO MILK/CREAM OR CREAMERS, sugar ok)  Clear Tea (NO MILK/CREAM OR CREAMERS, sugar ok) regular and decaf                             Plain Jell-O (NO RED)                                           Fruit ices (not with fruit pulp, NO RED)                                     Popsicles (NO RED)                                                               Sports drinks like Gatorade (NO RED)   The day of surgery:  Drink ONE (1) Pre-Surgery Clear Ensure at : 5:40 AM the morning of surgery. Drink in one sitting. Do not sip.  This drink was given to you during your hospital  pre-op appointment visit. Nothing else to drink after completing the  Pre-Surgery Clear Ensure or G2.          If you have questions, please contact your  surgeon's office.  FOLLOW ANY ADDITIONAL PRE OP INSTRUCTIONS YOU RECEIVED FROM YOUR SURGEON'S OFFICE!!!   Oral Hygiene is also important to reduce your risk of infection.                                    Remember - BRUSH YOUR TEETH THE MORNING OF SURGERY WITH YOUR REGULAR TOOTHPASTE  DENTURES WILL BE REMOVED PRIOR TO SURGERY PLEASE DO NOT APPLY Poly grip OR ADHESIVES!!!   Do NOT smoke after Midnight   Stop all  vitamins and herbal supplements 7 days before surgery.   Take these medicines the morning of surgery with A SIP OF WATER: metoprolol ,amlodipine,levothyroxine .Use inhalers as usual.                              You may not have any metal on your body including hair pins, jewelry, and body piercing             Do not wear make-up, lotions, powders, perfumes/cologne, or deodorant  Do not wear nail polish including gel and S&S, artificial/acrylic nails, or any other type of covering on natural nails including finger and toenails. If you have artificial nails, gel coating, etc. that needs to be removed by a nail salon please have this removed prior to surgery or surgery may need to be canceled/ delayed if the surgeon/ anesthesia feels like they are unable to be safely monitored.   Do not shave  48 hours prior to surgery.    Do not bring valuables to the hospital. [place] IS NOT             RESPONSIBLE   FOR VALUABLES.   Contacts, glasses, dentures or bridgework may not be worn into surgery.   Bring small overnight bag day of surgery.   DO NOT BRING YOUR HOME MEDICATIONS TO THE HOSPITAL. PHARMACY WILL DISPENSE MEDICATIONS LISTED ON YOUR MEDICATION LIST TO YOU DURING YOUR ADMISSION IN THE HOSPITAL!    Patients discharged on the day of surgery will not be allowed to drive home.  Someone NEEDS to stay with you for the first 24 hours after anesthesia.   Special Instructions: Bring a copy of your healthcare power of attorney and living will documents the day of surgery if you  haven't scanned them before.              Please read over the following fact sheets you were given: IF YOU HAVE QUESTIONS ABOUT YOUR PRE-OP INSTRUCTIONS PLEASE CALL [phone]   If you received a COVID test during your pre-op visit  it is requested that you wear a mask when out in public, stay away from anyone that may not be feeling well and notify your surgeon if you develop symptoms. If you test positive for Covid or have been in contact with anyone that has tested positive in the last 10 days please notify you surgeon.      Pre-operative 5 CHG Bath Instructions   You can play a key role in reducing the risk of infection after surgery. Your skin needs to be as free of germs as possible. You can reduce the number of germs on your skin by washing with CHG (chlorhexidine  gluconate) soap before surgery. CHG is an antiseptic soap that kills germs and continues to kill germs even after washing.   DO NOT use if you have an allergy to chlorhexidine /CHG or antibacterial soaps. If your skin becomes reddened or irritated, stop using the CHG and notify one of our RNs at [phone].   Please shower with the CHG soap starting 4 days before surgery using the following schedule:     Please keep in mind the following:  DO NOT shave, including legs and underarms, starting the day of your first shower.   You may shave your face at any point before/day of surgery.  Place clean sheets on your bed the day you start using CHG soap. Use a clean washcloth (not used since being washed)  for each shower. DO NOT sleep with pets once you start using the CHG.   CHG Shower Instructions:  If you choose to wash your hair and private area, wash first with your normal shampoo/soap.  After you use shampoo/soap, rinse your hair and body thoroughly to remove shampoo/soap residue.  Turn the water OFF and apply about 3 tablespoons (45 ml) of CHG soap to a CLEAN washcloth.  Apply CHG soap ONLY FROM YOUR NECK DOWN TO  YOUR TOES (washing for 3-5 minutes)  DO NOT use CHG soap on face, private areas, open wounds, or sores.  Pay special attention to the area where your surgery is being performed.  If you are having back surgery, having someone wash your back for you may be helpful. Wait 2 minutes after CHG soap is applied, then you may rinse off the CHG soap.  Pat dry with a clean towel  Put on clean clothes/pajamas   If you choose to wear lotion, please use ONLY the CHG-compatible lotions on the back of this paper.     Additional instructions for the day of surgery: DO NOT APPLY any lotions, deodorants, cologne, or perfumes.   Put on clean/comfortable clothes.  Brush your teeth.  Ask your nurse before applying any prescription medications to the skin.   CHG Compatible Lotions   Aveeno Moisturizing lotion  Cetaphil Moisturizing Cream  Cetaphil Moisturizing Lotion  Clairol Herbal Essence Moisturizing Lotion, Dry Skin  Clairol Herbal Essence Moisturizing Lotion, Extra Dry Skin  Clairol Herbal Essence Moisturizing Lotion, Normal Skin  Curel Age Defying Therapeutic Moisturizing Lotion with Alpha Hydroxy  Curel Extreme Care Body Lotion  Curel Soothing Hands Moisturizing Hand Lotion  Curel Therapeutic Moisturizing Cream, Fragrance-Free  Curel Therapeutic Moisturizing Lotion, Fragrance-Free  Curel Therapeutic Moisturizing Lotion, Original Formula  Eucerin Daily Replenishing Lotion  Eucerin Dry Skin Therapy Plus Alpha Hydroxy Crme  Eucerin Dry Skin Therapy Plus Alpha Hydroxy Lotion  Eucerin Original Crme  Eucerin Original Lotion  Eucerin Plus Crme Eucerin Plus Lotion  Eucerin TriLipid Replenishing Lotion  Keri Anti-Bacterial Hand Lotion  Keri Deep Conditioning Original Lotion Dry Skin Formula Softly Scented  Keri Deep Conditioning Original Lotion, Fragrance Free Sensitive Skin Formula  Keri Lotion Fast Absorbing Fragrance Free Sensitive Skin Formula  Keri Lotion Fast Absorbing Softly Scented Dry  Skin Formula  Keri Original Lotion  Keri Skin Renewal Lotion Keri Silky Smooth Lotion  Keri Silky Smooth Sensitive Skin Lotion  Nivea Body Creamy Conditioning Oil  Nivea Body Extra Enriched Lotion  Nivea Body Original Lotion  Nivea Body Sheer Moisturizing Lotion Nivea Crme  Nivea Skin Firming Lotion  NutraDerm 30 Skin Lotion  NutraDerm Skin Lotion  NutraDerm Therapeutic Skin Cream  NutraDerm Therapeutic Skin Lotion  ProShield Protective Hand Cream  Provon moisturizing lotion   Incentive Spirometer  An incentive spirometer is a tool that can help keep your lungs clear and active. This tool measures how well you are filling your lungs with each breath. Taking long deep breaths may help reverse or decrease the chance of developing breathing (pulmonary) problems (especially infection) following: A long period of time when you are unable to move or be active. BEFORE THE PROCEDURE  If the spirometer includes an indicator to show your best effort, your nurse or respiratory therapist will set it to a desired goal. If possible, sit up straight or lean slightly forward. Try not to slouch. Hold the incentive spirometer in an upright position. INSTRUCTIONS FOR USE  Sit on the edge of your bed  if possible, or sit up as far as you can in bed or on a chair. Hold the incentive spirometer in an upright position. Breathe out normally. Place the mouthpiece in your mouth and seal your lips tightly around it. Breathe in slowly and as deeply as possible, raising the piston or the ball toward the top of the column. Hold your breath for 3-5 seconds or for as long as possible. Allow the piston or ball to fall to the bottom of the column. Remove the mouthpiece from your mouth and breathe out normally. Rest for a few seconds and repeat Steps 1 through 7 at least 10 times every 1-2 hours when you are awake. Take your time and take a few normal breaths between deep breaths. The spirometer may include an  indicator to show your best effort. Use the indicator as a goal to work toward during each repetition. After each set of 10 deep breaths, practice coughing to be sure your lungs are clear. If you have an incision (the cut made at the time of surgery), support your incision when coughing by placing a pillow or rolled up towels firmly against it. Once you are able to get out of bed, walk around indoors and cough well. You may stop using the incentive spirometer when instructed by your caregiver.  RISKS AND COMPLICATIONS Take your time so you do not get dizzy or light-headed. If you are in pain, you may need to take or ask for pain medication before doing incentive spirometry. It is harder to take a deep breath if you are having pain. AFTER USE Rest and breathe slowly and easily. It can be helpful to keep track of a log of your progress. Your caregiver can provide you with a simple table to help with this. If you are using the spirometer at home, follow these instructions: SEEK MEDICAL CARE IF:  You are having difficultly using the spirometer. You have trouble using the spirometer as often as instructed. Your pain medication is not giving enough relief while using the spirometer. You develop fever of 100.5 F (38.1 C) or higher. SEEK IMMEDIATE MEDICAL CARE IF:  You cough up bloody sputum that had not been present before. You develop fever of 102 F (38.9 C) or greater. You develop worsening pain at or near the incision site. MAKE SURE YOU:  Understand these instructions. Will watch your condition. Will get help right away if you are not doing well or get worse. Document Released: 05/08/2006 Document Revised: 03/20/2011 Document Reviewed: 07/09/2006 [facility] Patient Information 2014 [place], [place].   ________________________________________________________________________

## 2023-07-04 ENCOUNTER — Other Ambulatory Visit: Payer: Self-pay

## 2023-07-04 ENCOUNTER — Encounter (HOSPITAL_COMMUNITY)
Admission: RE | Admit: 2023-07-04 | Discharge: 2023-07-04 | Disposition: A | Discharge status: Home / Self Care | Source: Ambulatory Visit | Attending: Orthopedic Surgery | Admitting: Orthopedic Surgery

## 2023-07-04 ENCOUNTER — Encounter (HOSPITAL_COMMUNITY): Payer: Self-pay

## 2023-07-04 VITALS — BP 151/69 | HR 51 | Temp 98.4°F | Ht 64.0 in | Wt 160.0 lb

## 2023-07-04 DIAGNOSIS — Z01818 Encounter for other preprocedural examination: Secondary | ICD-10-CM | POA: Insufficient documentation

## 2023-07-04 DIAGNOSIS — M1612 Unilateral primary osteoarthritis, left hip: Secondary | ICD-10-CM | POA: Insufficient documentation

## 2023-07-04 DIAGNOSIS — I10 Essential (primary) hypertension: Secondary | ICD-10-CM | POA: Diagnosis not present

## 2023-07-04 HISTORY — DX: Anxiety disorder, unspecified: F41.9

## 2023-07-04 LAB — SURGICAL PCR SCREEN
MRSA, PCR: NEGATIVE
Staphylococcus aureus: NEGATIVE

## 2023-07-04 NOTE — Progress Notes (Signed)
 For Anesthesia: PCP - Tanda Katz, MD  Cardiologist - N/A  Bowel Prep reminder:  Chest x-ray -  EKG - 07/04/23 Stress Test -  ECHO -  Cardiac Cath -  Pacemaker/ICD device last checked: Pacemaker orders received: Device Rep notified:  Spinal Cord Stimulator:N/A  Sleep Study - N/A CPAP -   Fasting Blood Sugar - N/A Checks Blood Sugar _____ times a day Date and result of last Hgb A1c-  Last dose of GLP1 agonist- N/A GLP1 instructions:   Last dose of SGLT-2 inhibitors- N/A SGLT-2 instructions:   Blood Thinner Instructions:N/A Aspirin  Instructions: Last Dose:  Activity level: Can go up a flight of stairs and activities of daily living without stopping and without chest pain and/or shortness of breath   Able to exercise without chest pain and/or shortness of breath  Anesthesia review: Hx: HTN  Patient denies shortness of breath, fever, cough and chest pain at PAT appointment   Patient verbalized understanding of instructions that were reviewed over the telephone.

## 2023-07-16 NOTE — Anesthesia Preprocedure Evaluation (Signed)
 Anesthesia Evaluation  Patient identified by MRN, date of birth, ID band Patient awake    Reviewed: Allergy & Precautions, H&P , NPO status , Patient's Chart, lab work & pertinent test results  History of Anesthesia Complications (+) PONV, Family history of anesthesia reaction and history of anesthetic complications  Airway Mallampati: II  TM Distance: >3 FB Neck ROM: Full    Dental no notable dental hx. (+) Teeth Intact, Dental Advisory Given   Pulmonary neg pulmonary ROS   Pulmonary exam normal breath sounds clear to auscultation       Cardiovascular Exercise Tolerance: Good hypertension, Pt. on medications and Pt. on home beta blockers  Rhythm:Regular Rate:Normal     Neuro/Psych  Headaches  Anxiety        GI/Hepatic Neg liver ROS,GERD  Medicated,,  Endo/Other  Hypothyroidism    Renal/GU negative Renal ROS  negative genitourinary   Musculoskeletal  (+) Arthritis ,    Abdominal   Peds  Hematology negative hematology ROS (+)   Anesthesia Other Findings   Reproductive/Obstetrics negative OB ROS                              Anesthesia Physical Anesthesia Plan  ASA: 2  Anesthesia Plan: Spinal   Post-op Pain Management: Ofirmev  IV (intra-op)*   Induction: Intravenous  PONV Risk Score and Plan: 4 or greater and Ondansetron , Dexamethasone  and Propofol  infusion  Airway Management Planned: Natural Airway and Simple Face Mask  Additional Equipment:   Intra-op Plan:   Post-operative Plan:   Informed Consent: I have reviewed the patients History and Physical, chart, labs and discussed the procedure including the risks, benefits and alternatives for the proposed anesthesia with the patient or authorized representative who has indicated his/her understanding and acceptance.     Dental advisory given  Plan Discussed with: CRNA  Anesthesia Plan Comments:           Anesthesia Quick Evaluation

## 2023-07-17 ENCOUNTER — Other Ambulatory Visit: Payer: Self-pay

## 2023-07-17 ENCOUNTER — Ambulatory Visit (HOSPITAL_COMMUNITY)

## 2023-07-17 ENCOUNTER — Observation Stay (HOSPITAL_COMMUNITY)
Admission: RE | Admit: 2023-07-17 | Discharge: 2023-07-18 | Disposition: A | Discharge status: Home / Self Care | Source: Ambulatory Visit | Attending: Orthopedic Surgery | Admitting: Orthopedic Surgery

## 2023-07-17 ENCOUNTER — Ambulatory Visit (HOSPITAL_BASED_OUTPATIENT_CLINIC_OR_DEPARTMENT_OTHER): Admitting: Anesthesiology

## 2023-07-17 ENCOUNTER — Ambulatory Visit (HOSPITAL_COMMUNITY): Admitting: Anesthesiology

## 2023-07-17 ENCOUNTER — Encounter (HOSPITAL_COMMUNITY): Payer: Self-pay | Admitting: Orthopedic Surgery

## 2023-07-17 ENCOUNTER — Encounter (HOSPITAL_COMMUNITY)
Admission: RE | Disposition: A | Discharge status: Home / Self Care | Payer: Self-pay | Source: Ambulatory Visit | Attending: Orthopedic Surgery

## 2023-07-17 DIAGNOSIS — M1612 Unilateral primary osteoarthritis, left hip: Principal | ICD-10-CM | POA: Insufficient documentation

## 2023-07-17 DIAGNOSIS — Z96653 Presence of artificial knee joint, bilateral: Secondary | ICD-10-CM | POA: Insufficient documentation

## 2023-07-17 DIAGNOSIS — Z96642 Presence of left artificial hip joint: Secondary | ICD-10-CM

## 2023-07-17 DIAGNOSIS — Z01818 Encounter for other preprocedural examination: Secondary | ICD-10-CM

## 2023-07-17 DIAGNOSIS — Z853 Personal history of malignant neoplasm of breast: Secondary | ICD-10-CM | POA: Diagnosis not present

## 2023-07-17 DIAGNOSIS — Z91040 Latex allergy status: Secondary | ICD-10-CM | POA: Diagnosis not present

## 2023-07-17 DIAGNOSIS — I10 Essential (primary) hypertension: Secondary | ICD-10-CM | POA: Insufficient documentation

## 2023-07-17 DIAGNOSIS — E039 Hypothyroidism, unspecified: Secondary | ICD-10-CM | POA: Insufficient documentation

## 2023-07-17 DIAGNOSIS — Z96649 Presence of unspecified artificial hip joint: Secondary | ICD-10-CM

## 2023-07-17 HISTORY — PX: TOTAL HIP ARTHROPLASTY: SHX124

## 2023-07-17 LAB — BPAM RBC
Blood Product Expiration Date: 202507232359
Blood Product Expiration Date: 202507232359
Unit Type and Rh: 6200
Unit Type and Rh: 6200

## 2023-07-17 LAB — TYPE AND SCREEN
ABO/RH(D): A POS
Antibody Screen: NEGATIVE
Unit division: 0
Unit division: 0

## 2023-07-17 SURGERY — ARTHROPLASTY, HIP, TOTAL, ANTERIOR APPROACH
Anesthesia: Spinal | Site: Hip | Laterality: Left

## 2023-07-17 MED ORDER — AMLODIPINE BESYLATE 5 MG PO TABS
5.0000 mg | ORAL_TABLET | Freq: Every day | ORAL | Status: DC
  Administered 2023-07-18: 5 mg via ORAL
  Filled 2023-07-17: qty 1

## 2023-07-17 MED ORDER — PHENYLEPHRINE HCL (PRESSORS) 10 MG/ML IV SOLN
INTRAVENOUS | Status: AC
  Filled 2023-07-17: qty 1

## 2023-07-17 MED ORDER — METHOCARBAMOL 1000 MG/10ML IJ SOLN: 500.0000 mg | Freq: Four times a day (QID) | INTRAMUSCULAR | Status: DC | PRN

## 2023-07-17 MED ORDER — PHENYLEPHRINE 80 MCG/ML (10ML) SYRINGE FOR IV PUSH (FOR BLOOD PRESSURE SUPPORT)
PREFILLED_SYRINGE | INTRAVENOUS | Status: DC | PRN
  Administered 2023-07-17: 160 ug via INTRAVENOUS
  Administered 2023-07-17: 80 ug via INTRAVENOUS
  Administered 2023-07-17: 160 ug via INTRAVENOUS

## 2023-07-17 MED ORDER — TRANEXAMIC ACID-NACL 1000-0.7 MG/100ML-% IV SOLN
1000.0000 mg | Freq: Once | INTRAVENOUS | Status: AC
  Administered 2023-07-17: 1000 mg via INTRAVENOUS

## 2023-07-17 MED ORDER — MONTELUKAST SODIUM 10 MG PO TABS
10.0000 mg | ORAL_TABLET | Freq: Every evening | ORAL | Status: DC
  Administered 2023-07-17: 10 mg via ORAL
  Filled 2023-07-17: qty 1

## 2023-07-17 MED ORDER — ACETAMINOPHEN 325 MG PO TABS: 325.0000 mg | ORAL_TABLET | Freq: Four times a day (QID) | ORAL | Status: DC | PRN

## 2023-07-17 MED ORDER — CHLORHEXIDINE GLUCONATE 0.12 % MT SOLN
15.0000 mL | Freq: Once | OROMUCOSAL | Status: AC
  Administered 2023-07-17: 15 mL via OROMUCOSAL

## 2023-07-17 MED ORDER — DEXAMETHASONE SODIUM PHOSPHATE 10 MG/ML IJ SOLN
10.0000 mg | Freq: Once | INTRAMUSCULAR | Status: AC
  Administered 2023-07-18: 10 mg via INTRAVENOUS
  Filled 2023-07-17: qty 1

## 2023-07-17 MED ORDER — HYDROCODONE-ACETAMINOPHEN 7.5-325 MG PO TABS
1.0000 | ORAL_TABLET | ORAL | Status: DC | PRN
  Administered 2023-07-17: 1 via ORAL

## 2023-07-17 MED ORDER — BUPIVACAINE IN DEXTROSE 0.75-8.25 % IT SOLN
INTRATHECAL | Status: DC | PRN
  Administered 2023-07-17: 1.6 mL via INTRATHECAL

## 2023-07-17 MED ORDER — METHOCARBAMOL 500 MG PO TABS
ORAL_TABLET | ORAL | Status: AC
  Filled 2023-07-17: qty 1

## 2023-07-17 MED ORDER — PANTOPRAZOLE SODIUM 40 MG PO TBEC
40.0000 mg | DELAYED_RELEASE_TABLET | Freq: Every day | ORAL | Status: DC
  Administered 2023-07-17 – 2023-07-18 (×2): 40 mg via ORAL
  Filled 2023-07-17 (×2): qty 1

## 2023-07-17 MED ORDER — LACTATED RINGERS IV SOLN: INTRAVENOUS | Status: DC

## 2023-07-17 MED ORDER — ACETAMINOPHEN 10 MG/ML IV SOLN
INTRAVENOUS | Status: DC | PRN
  Administered 2023-07-17: 1000 mg via INTRAVENOUS

## 2023-07-17 MED ORDER — FENTANYL CITRATE (PF) 250 MCG/5ML IJ SOLN
INTRAMUSCULAR | Status: DC | PRN
  Administered 2023-07-17: 50 ug via INTRAVENOUS

## 2023-07-17 MED ORDER — DIPHENHYDRAMINE HCL 12.5 MG/5ML PO ELIX: 12.5000 mg | ORAL_SOLUTION | ORAL | Status: DC | PRN

## 2023-07-17 MED ORDER — ORAL CARE MOUTH RINSE
15.0000 mL | Freq: Once | OROMUCOSAL | Status: AC
Start: 2023-07-17 — End: 2023-07-17

## 2023-07-17 MED ORDER — DEXAMETHASONE SODIUM PHOSPHATE 10 MG/ML IJ SOLN
INTRAMUSCULAR | Status: AC
  Filled 2023-07-17: qty 1

## 2023-07-17 MED ORDER — ACETAMINOPHEN 10 MG/ML IV SOLN
INTRAVENOUS | Status: AC
  Filled 2023-07-17: qty 100

## 2023-07-17 MED ORDER — ONDANSETRON HCL 4 MG PO TABS: 4.0000 mg | ORAL_TABLET | Freq: Four times a day (QID) | ORAL | Status: DC | PRN

## 2023-07-17 MED ORDER — SENNA 8.6 MG PO TABS
2.0000 | ORAL_TABLET | Freq: Every day | ORAL | Status: DC
  Administered 2023-07-17: 17.2 mg via ORAL
  Filled 2023-07-17: qty 2

## 2023-07-17 MED ORDER — BUPIVACAINE HCL (PF) 0.25 % IJ SOLN
INTRAMUSCULAR | Status: AC
  Filled 2023-07-17: qty 30

## 2023-07-17 MED ORDER — EPHEDRINE 5 MG/ML INJ
INTRAVENOUS | Status: AC
  Filled 2023-07-17: qty 5

## 2023-07-17 MED ORDER — MENTHOL 3 MG MT LOZG: 1.0000 | LOZENGE | OROMUCOSAL | Status: DC | PRN

## 2023-07-17 MED ORDER — SODIUM CHLORIDE (PF) 0.9 % IJ SOLN
INTRAMUSCULAR | Status: DC | PRN
  Administered 2023-07-17 (×2): 61 mL

## 2023-07-17 MED ORDER — HYDROCODONE-ACETAMINOPHEN 7.5-325 MG PO TABS
ORAL_TABLET | ORAL | Status: AC
  Filled 2023-07-17: qty 1

## 2023-07-17 MED ORDER — BUPIVACAINE-EPINEPHRINE (PF) 0.25% -1:200000 IJ SOLN
INTRAMUSCULAR | Status: AC
Start: 2023-07-17 — End: 2023-07-17
  Filled 2023-07-17: qty 30

## 2023-07-17 MED ORDER — ALUM & MAG HYDROXIDE-SIMETH 200-200-20 MG/5ML PO SUSP: 30.0000 mL | ORAL | Status: DC | PRN

## 2023-07-17 MED ORDER — PHENOL 1.4 % MT LIQD: 1.0000 | OROMUCOSAL | Status: DC | PRN

## 2023-07-17 MED ORDER — HYDROCHLOROTHIAZIDE 25 MG PO TABS
25.0000 mg | ORAL_TABLET | Freq: Every day | ORAL | Status: DC
  Administered 2023-07-18: 25 mg via ORAL
  Filled 2023-07-17: qty 1

## 2023-07-17 MED ORDER — METOCLOPRAMIDE HCL 5 MG PO TABS: 5.0000 mg | ORAL_TABLET | Freq: Three times a day (TID) | ORAL | Status: DC | PRN

## 2023-07-17 MED ORDER — BISACODYL 10 MG RE SUPP: 10.0000 mg | Freq: Every day | RECTAL | Status: DC | PRN

## 2023-07-17 MED ORDER — 0.9 % SODIUM CHLORIDE (POUR BTL) OPTIME
TOPICAL | Status: DC | PRN
  Administered 2023-07-17: 1000 mL

## 2023-07-17 MED ORDER — ONDANSETRON HCL 4 MG/2ML IJ SOLN
4.0000 mg | Freq: Four times a day (QID) | INTRAMUSCULAR | Status: DC | PRN
  Administered 2023-07-17 – 2023-07-18 (×2): 4 mg via INTRAVENOUS
  Filled 2023-07-17: qty 2

## 2023-07-17 MED ORDER — TRANEXAMIC ACID-NACL 1000-0.7 MG/100ML-% IV SOLN
1000.0000 mg | INTRAVENOUS | Status: AC
  Administered 2023-07-17: 1000 mg via INTRAVENOUS
  Filled 2023-07-17: qty 100

## 2023-07-17 MED ORDER — CEFAZOLIN SODIUM-DEXTROSE 2-4 GM/100ML-% IV SOLN
2.0000 g | INTRAVENOUS | Status: AC
Start: 2023-07-17 — End: 2023-07-17
  Administered 2023-07-17: 2 g via INTRAVENOUS
  Filled 2023-07-17: qty 100

## 2023-07-17 MED ORDER — FENTANYL CITRATE PF 50 MCG/ML IJ SOSY
PREFILLED_SYRINGE | INTRAMUSCULAR | Status: AC
  Filled 2023-07-17: qty 2

## 2023-07-17 MED ORDER — MIDAZOLAM HCL 2 MG/2ML IJ SOLN
INTRAMUSCULAR | Status: AC
  Filled 2023-07-17: qty 2

## 2023-07-17 MED ORDER — ASPIRIN 81 MG PO CHEW
81.0000 mg | CHEWABLE_TABLET | Freq: Two times a day (BID) | ORAL | Status: DC
  Administered 2023-07-17 – 2023-07-18 (×2): 81 mg via ORAL
  Filled 2023-07-17 (×2): qty 1

## 2023-07-17 MED ORDER — FENTANYL CITRATE PF 50 MCG/ML IJ SOSY
PREFILLED_SYRINGE | INTRAMUSCULAR | Status: AC
  Filled 2023-07-17: qty 1

## 2023-07-17 MED ORDER — METHOCARBAMOL 500 MG PO TABS
500.0000 mg | ORAL_TABLET | Freq: Four times a day (QID) | ORAL | Status: DC | PRN
  Administered 2023-07-17 – 2023-07-18 (×3): 500 mg via ORAL
  Filled 2023-07-17 (×2): qty 1

## 2023-07-17 MED ORDER — MIDAZOLAM HCL 2 MG/2ML IJ SOLN
INTRAMUSCULAR | Status: DC | PRN
  Administered 2023-07-17: 2 mg via INTRAVENOUS

## 2023-07-17 MED ORDER — FENTANYL CITRATE PF 50 MCG/ML IJ SOSY
25.0000 ug | PREFILLED_SYRINGE | INTRAMUSCULAR | Status: DC | PRN
  Administered 2023-07-17 (×2): 50 ug via INTRAVENOUS

## 2023-07-17 MED ORDER — CEFAZOLIN SODIUM-DEXTROSE 2-4 GM/100ML-% IV SOLN
2.0000 g | Freq: Four times a day (QID) | INTRAVENOUS | Status: AC
  Administered 2023-07-17 (×2): 2 g via INTRAVENOUS
  Filled 2023-07-17 (×2): qty 100

## 2023-07-17 MED ORDER — STERILE WATER FOR IRRIGATION IR SOLN
Status: DC | PRN
  Administered 2023-07-17: 2000 mL

## 2023-07-17 MED ORDER — SODIUM CHLORIDE (PF) 0.9 % IJ SOLN
INTRAMUSCULAR | Status: AC
Start: 2023-07-17 — End: 2023-07-17
  Filled 2023-07-17: qty 30

## 2023-07-17 MED ORDER — SODIUM CHLORIDE 0.9% FLUSH: 3.0000 mL | INTRAVENOUS | Status: DC | PRN

## 2023-07-17 MED ORDER — POVIDONE-IODINE 10 % EX SWAB: 2.0000 | Freq: Once | CUTANEOUS | Status: DC

## 2023-07-17 MED ORDER — SODIUM CHLORIDE 0.9% FLUSH
3.0000 mL | Freq: Two times a day (BID) | INTRAVENOUS | Status: DC
  Administered 2023-07-18: 10 mL via INTRAVENOUS

## 2023-07-17 MED ORDER — DEXAMETHASONE SODIUM PHOSPHATE 10 MG/ML IJ SOLN
8.0000 mg | Freq: Once | INTRAMUSCULAR | Status: AC
  Administered 2023-07-17: 10 mg via INTRAVENOUS

## 2023-07-17 MED ORDER — DROPERIDOL 2.5 MG/ML IJ SOLN
INTRAMUSCULAR | Status: AC
  Filled 2023-07-17: qty 2

## 2023-07-17 MED ORDER — PROPOFOL 10 MG/ML IV BOLUS
INTRAVENOUS | Status: AC
  Filled 2023-07-17: qty 20

## 2023-07-17 MED ORDER — TRANEXAMIC ACID-NACL 1000-0.7 MG/100ML-% IV SOLN
INTRAVENOUS | Status: AC
  Filled 2023-07-17: qty 100

## 2023-07-17 MED ORDER — POLYETHYLENE GLYCOL 3350 17 G PO PACK
17.0000 g | PACK | Freq: Two times a day (BID) | ORAL | Status: DC
  Administered 2023-07-17 – 2023-07-18 (×2): 17 g via ORAL
  Filled 2023-07-17 (×2): qty 1

## 2023-07-17 MED ORDER — PHENYLEPHRINE HCL-NACL 20-0.9 MG/250ML-% IV SOLN
INTRAVENOUS | Status: DC | PRN
  Administered 2023-07-17: 50 ug/min via INTRAVENOUS

## 2023-07-17 MED ORDER — MORPHINE SULFATE (PF) 2 MG/ML IV SOLN: 0.5000 mg | INTRAVENOUS | Status: DC | PRN

## 2023-07-17 MED ORDER — HYDROCODONE-ACETAMINOPHEN 5-325 MG PO TABS
1.0000 | ORAL_TABLET | ORAL | Status: DC | PRN
  Administered 2023-07-17: 1 via ORAL
  Administered 2023-07-18 (×2): 2 via ORAL
  Filled 2023-07-17: qty 1
  Filled 2023-07-17 (×2): qty 2

## 2023-07-17 MED ORDER — FENTANYL CITRATE (PF) 100 MCG/2ML IJ SOLN
INTRAMUSCULAR | Status: AC
  Filled 2023-07-17: qty 2

## 2023-07-17 MED ORDER — EPHEDRINE SULFATE-NACL 50-0.9 MG/10ML-% IV SOSY
PREFILLED_SYRINGE | INTRAVENOUS | Status: DC | PRN
Start: 2023-07-17 — End: 2023-07-17
  Administered 2023-07-17: 5 mg via INTRAVENOUS
  Administered 2023-07-17 (×2): 10 mg via INTRAVENOUS

## 2023-07-17 MED ORDER — METOCLOPRAMIDE HCL 5 MG/ML IJ SOLN: 5.0000 mg | Freq: Three times a day (TID) | INTRAMUSCULAR | Status: DC | PRN

## 2023-07-17 MED ORDER — DROPERIDOL 2.5 MG/ML IJ SOLN
0.6250 mg | Freq: Once | INTRAMUSCULAR | Status: AC
  Administered 2023-07-17: 0.625 mg via INTRAVENOUS

## 2023-07-17 MED ORDER — ONDANSETRON HCL 4 MG/2ML IJ SOLN
INTRAMUSCULAR | Status: AC
Start: 2023-07-17 — End: 2023-07-17
  Filled 2023-07-17: qty 2

## 2023-07-17 MED ORDER — LEVOTHYROXINE SODIUM 75 MCG PO TABS
75.0000 ug | ORAL_TABLET | Freq: Every day | ORAL | Status: DC
  Administered 2023-07-18: 75 ug via ORAL
  Filled 2023-07-17: qty 1

## 2023-07-17 MED ORDER — METOPROLOL SUCCINATE ER 25 MG PO TB24
25.0000 mg | ORAL_TABLET | Freq: Every day | ORAL | Status: DC
  Administered 2023-07-18: 25 mg via ORAL
  Filled 2023-07-17: qty 1

## 2023-07-17 MED ORDER — ALBUTEROL SULFATE (2.5 MG/3ML) 0.083% IN NEBU: 2.5000 mg | INHALATION_SOLUTION | RESPIRATORY_TRACT | Status: DC | PRN

## 2023-07-17 MED ORDER — KETOROLAC TROMETHAMINE 30 MG/ML IJ SOLN
INTRAMUSCULAR | Status: AC
  Filled 2023-07-17: qty 1

## 2023-07-17 MED ORDER — PROPOFOL 500 MG/50ML IV EMUL
INTRAVENOUS | Status: DC | PRN
  Administered 2023-07-17: 50 ug/kg/min via INTRAVENOUS

## 2023-07-17 MED ORDER — ROSUVASTATIN CALCIUM 10 MG PO TABS
10.0000 mg | ORAL_TABLET | Freq: Every day | ORAL | Status: DC
  Administered 2023-07-18: 10 mg via ORAL
  Filled 2023-07-17: qty 1

## 2023-07-17 SURGICAL SUPPLY — 39 items
BAG COUNTER SPONGE SURGICOUNT (BAG) IMPLANT
BAG ZIPLOCK 12X15 (MISCELLANEOUS) ×1 IMPLANT
BLADE SAG 18X100X1.27 (BLADE) ×1 IMPLANT
COVER PERINEAL POST (MISCELLANEOUS) ×1 IMPLANT
COVER SURGICAL LIGHT HANDLE (MISCELLANEOUS) ×1 IMPLANT
CUP ACET PINNACLE SECTR 50MM (Hips) IMPLANT
DERMABOND ADVANCED .7 DNX12 (GAUZE/BANDAGES/DRESSINGS) ×1 IMPLANT
DRAPE FOOT SWITCH (DRAPES) ×1 IMPLANT
DRAPE STERI IOBAN 125X83 (DRAPES) ×1 IMPLANT
DRAPE U-SHAPE 47X51 STRL (DRAPES) ×2 IMPLANT
DRESSING AQUACEL AG SP 3.5X10 (GAUZE/BANDAGES/DRESSINGS) ×1 IMPLANT
DRSG AQUACEL AG ADV 3.5X10 (GAUZE/BANDAGES/DRESSINGS) IMPLANT
DURAPREP 26ML APPLICATOR (WOUND CARE) ×1 IMPLANT
ELECT REM PT RETURN 15FT ADLT (MISCELLANEOUS) ×1 IMPLANT
FEMORAL STEM 12/14 TPR SZ4 HIP (Orthopedic Implant) IMPLANT
GLOVE BIO SURGEON STRL SZ 6 (GLOVE) ×1 IMPLANT
GLOVE BIOGEL PI IND STRL 6.5 (GLOVE) ×1 IMPLANT
GLOVE BIOGEL PI IND STRL 7.5 (GLOVE) ×1 IMPLANT
GLOVE ORTHO TXT STRL SZ7.5 (GLOVE) ×2 IMPLANT
GOWN STRL REUS W/ TWL LRG LVL3 (GOWN DISPOSABLE) ×2 IMPLANT
HEAD FEMORAL 32 CERAMIC (Hips) IMPLANT
HOLDER FOLEY CATH W/STRAP (MISCELLANEOUS) ×1 IMPLANT
KIT TURNOVER KIT A (KITS) ×1 IMPLANT
LINER ACET PNNCL PLUS4 NEUTRAL (Hips) IMPLANT
MANIFOLD NEPTUNE II (INSTRUMENTS) ×1 IMPLANT
NDL SAFETY ECLIPSE 18X1.5 (NEEDLE) ×1 IMPLANT
PACK ANTERIOR HIP CUSTOM (KITS) ×1 IMPLANT
PENCIL SMOKE EVACUATOR (MISCELLANEOUS) ×1 IMPLANT
SCREW 6.5MMX30MM (Screw) IMPLANT
SUT MNCRL AB 4-0 PS2 18 (SUTURE) ×1 IMPLANT
SUT VIC AB 1 CT1 36 (SUTURE) ×3 IMPLANT
SUT VIC AB 2-0 CT1 TAPERPNT 27 (SUTURE) ×2 IMPLANT
SUTURE STRATFX 0 PDS 27 VIOLET (SUTURE) ×1 IMPLANT
SYR 3ML LL SCALE MARK (SYRINGE) ×1 IMPLANT
TOWEL GREEN STERILE FF (TOWEL DISPOSABLE) ×1 IMPLANT
TRAY FOL W/BAG SLVR 16FR STRL (SET/KITS/TRAYS/PACK) IMPLANT
TRAY FOLEY MTR SLVR 16FR STAT (SET/KITS/TRAYS/PACK) ×1 IMPLANT
TUBE SUCTION HIGH CAP CLEAR NV (SUCTIONS) ×1 IMPLANT
WATER STERILE IRR 1000ML POUR (IV SOLUTION) ×1 IMPLANT

## 2023-07-17 NOTE — Op Note (Signed)
 NAME:  AURIELLE SLINGERLAND                ACCOUNT NO.: [id number]      MEDICAL RECORD NO.: [id number]      FACILITY:  [facility]      PHYSICIAN:  Donnice JONETTA Car  DATE OF BIRTH:  11-27-1950     DATE OF PROCEDURE:  07/17/2023                                 OPERATIVE REPORT         PREOPERATIVE DIAGNOSIS: Left  hip osteoarthritis.      POSTOPERATIVE DIAGNOSIS:  Left hip osteoarthritis.      PROCEDURE:  Left total hip replacement through an anterior approach   utilizing DePuy THR system, component size 50 mm pinnacle cup, a size 32+1 neutral   Altrex liner, a size 4Hi Actis stem with a 32+1 delta ceramic   ball.       SURGEON:  Donnice JONETTA. Car, M.D.      ASSISTANT:  Rosina Calin, PA-C     ANESTHESIA:  Spinal.      SPECIMENS:  None.      COMPLICATIONS:  None.      BLOOD LOSS:  250 cc     DRAINS:  None.      INDICATION OF THE PROCEDURE:  Christina Crawford is a 73 y.o. female who had   presented to office for evaluation of left hip pain.  Radiographs revealed   progressive degenerative changes with bone-on-bone   articulation of the  hip joint, including subchondral cystic changes and osteophytes.  The patient had painful limited range of   motion significantly affecting their overall quality of life and function.  The patient was failing to    respond to conservative measures including medications and/or injections and activity modification and at this point was ready   to proceed with more definitive measures.  Consent was obtained for   benefit of pain relief.  Specific risks of infection, DVT, component   failure, dislocation, neurovascular injury, and need for revision surgery were reviewed in the office.     PROCEDURE IN DETAIL:  The patient was brought to operative theater.   Once adequate anesthesia, preoperative antibiotics, 2 gm of Ancef , 1 gm of Tranexamic Acid , and 10 mg of Decadron  were administered, the patient was positioned supine on the [employer] table.  Once the patient was safely positioned with adequate padding of boney prominences we predraped out the hip, and used fluoroscopy to confirm orientation of the pelvis.      The left hip was then prepped and draped from proximal iliac crest to   mid thigh with a shower curtain technique.      Time-out was performed identifying the patient, planned procedure, and the appropriate extremity.     An incision was then made 2 cm lateral to the   anterior superior iliac spine extending over the orientation of the   tensor fascia lata muscle and sharp dissection was carried down to the   fascia of the muscle.      The fascia was then incised.  The muscle belly was identified and swept   laterally and retractor placed along the superior neck.  Following   cauterization of the circumflex vessels and removing some pericapsular   fat, a second cobra retractor was placed on the inferior  neck.  A T-capsulotomy was made along the line of the   superior neck to the trochanteric fossa, then extended proximally and   distally.  Tag sutures were placed and the retractors were then placed   intracapsular.  We then identified the trochanteric fossa and   orientation of my neck cut and then made a neck osteotomy with the femur on traction.  The femoral   head was removed without difficulty or complication.  Traction was let   off and retractors were placed posterior and anterior around the   acetabulum.      The labrum and foveal tissue were debrided.  I began reaming with a 45 mm   reamer and reamed up to 49 mm reamer with good bony bed preparation and a 50 mm  cup was chosen.  The final 50 mm Pinnacle cup was then impacted under fluoroscopy to confirm the depth of penetration and orientation with respect to   Abduction and forward flexion.  A screw was placed into the ilium followed by the hole eliminator.  The final   32+4 neutral Altrex liner was impacted with good visualized rim fit.  The cup  was positioned anatomically within the acetabular portion of the pelvis.      At this point, the femur was rolled to 100 degrees.  Further capsule was   released off the inferior aspect of the femoral neck.  I then   released the superior capsule proximally.  With the leg in a neutral position the hook was placed laterally   along the femur under the vastus lateralis origin and elevated manually and then held in position using the hook attachment on the bed.  The leg was then extended and adducted with the leg rolled to 100   degrees of external rotation.  Retractors were placed along the medial calcar and posteriorly over the greater trochanter.  Once the proximal femur was fully   exposed, I used a box osteotome to set orientation.  I then began   broaching with the starting chili pepper broach and passed this by hand and then broached up to 4.  With the 4 broach in place I chose a high offset neck and did several trial reductions.  The offset was appropriate, leg lengths   appeared to be equal best matched with the +1 head ball trial confirmed radiographically.   Given these findings, I went ahead and dislocated the hip, repositioned all   retractors and positioned the right hip in the extended and abducted position.  The final 4 Hi Actis stem was   chosen and it was impacted down to the level of neck cut.  Based on this   and the trial reductions, a final 32+1 delta ceramic ball was chosen and   impacted onto a clean and dry trunnion, and the hip was reduced.  The   hip had been irrigated throughout the case again at this point.  I did   reapproximate the superior capsular leaflet to the anterior leaflet   using #1 Vicryl.  The fascia of the   tensor fascia lata muscle was then reapproximated using #1 Vicryl and #0 Stratafix sutures.  The   remaining wound was closed with 2-0 Vicryl and running 4-0 Monocryl.   The hip was cleaned, dried, and dressed sterilely using Dermabond and   Aquacel  dressing.  The patient was then brought   to recovery room in stable condition tolerating the procedure well.    Rosina  Patti, PA-C was present for the entirety of the case involved from   preoperative positioning, perioperative retractor management, general   facilitation of the case, as well as primary wound closure as assistant.            Donnice CORDOBA Ernie, M.D.        07/17/2023 7:34 AM

## 2023-07-17 NOTE — Discharge Instructions (Signed)

## 2023-07-17 NOTE — Evaluation (Signed)
 Physical Therapy Evaluation Patient Details Name: Christina Crawford MRN: [id number] DOB: Oct 15, 1950 Today's Date: 07/17/2023  History of Present Illness  Pt is 73 yo female s/p L anterior THA on 07/17/23.  Pt with hx including but not limited to  breast CA, mastectomy, bil TKA, arthritis, HLD, HTN, lymphedema L arm  Clinical Impression  Pt is s/p THA resulting in the deficits listed below (see PT Problem List). At baseline, pt is independent and lives with spouse.  She has DME and home support.  Today, pt seen in PACU for possible same day d/c; however, was limited by nausea, lightheadedness, and orthostatic BP.  She did sit EOB with min A but unable to progress further.  Pt and sister report uncomfortable returning home tonight.  Pt needs further therapy prior to d/c home -plan to f/u tomorrow as pt not able today. Pt will benefit from acute skilled PT to increase their independence and safety with mobility to facilitate discharge.          If plan is discharge home, recommend the following: A lot of help with walking and/or transfers;A lot of help with bathing/dressing/bathroom   Can travel by private vehicle        Equipment Recommendations None recommended by PT  Recommendations for Other Services       Functional Status Assessment Patient has had a recent decline in their functional status and demonstrates the ability to make significant improvements in function in a reasonable and predictable amount of time.     Precautions / Restrictions Precautions Precautions: Fall Restrictions Weight Bearing Restrictions Per Provider Order: Yes LLE Weight Bearing Per Provider Order: Weight bearing as tolerated      Mobility  Bed Mobility Overal bed mobility: Needs Assistance Bed Mobility: Supine to Sit, Sit to Supine     Supine to sit: Min assist Sit to supine: Min assist        Transfers                   General transfer comment: unable due to lightheaded/nausea     Ambulation/Gait                  Stairs            Wheelchair Mobility     Tilt Bed    Modified Rankin (Stroke Patients Only)       Balance Overall balance assessment: Needs assistance Sitting-balance support: No upper extremity supported Sitting balance-Leahy Scale: Good                                       Pertinent Vitals/Pain Pain Assessment Pain Assessment: 0-10 Pain Score: 4  Pain Location: L hip Pain Descriptors / Indicators: Discomfort Pain Intervention(s): Limited activity within patient's tolerance, Monitored during session, Premedicated before session    Home Living Family/patient expects to be discharged to:: Private residence Living Arrangements: Spouse/significant other Available Help at Discharge: Family;Available 24 hours/day Type of Home: House Home Access: Level entry     Alternate Level Stairs-Number of Steps: ONly 3 small steps going up to bedrooms with rail on left Home Layout: Multi-level Home Equipment: Rolling Walker (2 wheels);Cane - single point;BSC/3in1;Grab bars - tub/shower;Shower seat      Prior Function Prior Level of Function : Independent/Modified Independent;Driving             Mobility Comments: could ambulate in community without  AD ADLs Comments: independent adls and iadls     Extremity/Trunk Assessment   Upper Extremity Assessment Upper Extremity Assessment: Overall WFL for tasks assessed    Lower Extremity Assessment Lower Extremity Assessment: LLE deficits/detail;RLE deficits/detail RLE Deficits / Details: ROM WFL; MMT 5/5 LLE Deficits / Details: Expected post op changes; ROM WFL; MMT : ankle 5/5, knee ext 3/5, hip 2/5    Cervical / Trunk Assessment Cervical / Trunk Assessment: Normal  Communication        Cognition Arousal: Alert Behavior During Therapy: WFL for tasks assessed/performed   PT - Cognitive impairments: No apparent impairments                                  Cueing       General Comments General comments (skin integrity, edema, etc.): Pt's BP 124/63 in supine.  Pt sat and felt nauseated but initially denied lightheaded.  BP in sitting 124/69.  Provided emesis bag, ice pack to neck and cold wash rag, pt declined drink.  Pt sat at least 3 mins with no improvement in nausea and reports getting lightheaded and was pale in color.  Returned to supine , immediate BP 97/53.  Pt's symptoms began to improve in supine.  BP up to 120/60 after 3 mins.  Pt reports since not feeling well, unable to get  up, and low BP would rather stay the night than PT try again later.  PT agrees.    Exercises     Assessment/Plan    PT Assessment Patient needs continued PT services  PT Problem List Decreased strength;Cardiopulmonary status limiting activity;Pain;Decreased range of motion;Decreased activity tolerance;Decreased balance;Decreased mobility;Decreased knowledge of use of DME       PT Treatment Interventions DME instruction;Gait training;Therapeutic exercise;Balance training;Stair training;Functional mobility training;Therapeutic activities;Patient/family education;Modalities    PT Goals (Current goals can be found in the Care Plan section)  Acute Rehab PT Goals Patient Stated Goal: return home PT Goal Formulation: With patient/family Time For Goal Achievement: 07/31/23 Potential to Achieve Goals: Good    Frequency 7X/week     Co-evaluation               AM-PAC PT 6 Clicks Mobility  Outcome Measure Help needed turning from your back to your side while in a flat bed without using bedrails?: A Little Help needed moving from lying on your back to sitting on the side of a flat bed without using bedrails?: A Little Help needed moving to and from a bed to a chair (including a wheelchair)?: A Little Help needed standing up from a chair using your arms (e.g., wheelchair or bedside chair)?: A Little Help needed to walk in hospital  room?: A Lot Help needed climbing 3-5 steps with a railing? : A Lot 6 Click Score: 16    End of Session Equipment Utilized During Treatment: Gait belt Activity Tolerance: Treatment limited secondary to medical complications (Comment) Patient left: in bed;with call bell/phone within reach;with family/visitor present Nurse Communication: Mobility status PT Visit Diagnosis: Other abnormalities of gait and mobility (R26.89);Muscle weakness (generalized) (M62.81)    Time: 8684-8658 PT Time Calculation (min) (ACUTE ONLY): 26 min   Charges:   PT Evaluation $PT Eval Low Complexity: 1 Low PT Treatments $Therapeutic Activity: 8-22 mins PT General Charges $$ ACUTE PT VISIT: 1 Visit         Benjiman, PT Acute Rehab [facility] [phone]   [email or web address]  H Lakisha Peyser 07/17/2023, 3:20 PM

## 2023-07-17 NOTE — Anesthesia Procedure Notes (Signed)
 Spinal  Patient location during procedure: OR Start time: 07/17/2023 8:34 AM End time: 07/17/2023 8:41 AM Reason for block: surgical anesthesia Staffing Performed: anesthesiologist  Anesthesiologist: Epifanio Fallow, MD Performed by: Epifanio Fallow, MD Authorized by: Epifanio Fallow, MD   Preanesthetic Checklist Completed: patient identified, IV checked, risks and benefits discussed, surgical consent, monitors and equipment checked, pre-op evaluation and timeout performed Spinal Block Patient position: sitting Prep: DuraPrep Patient monitoring: cardiac monitor, continuous pulse ox and blood pressure Approach: right paramedian Location: L3-4 Injection technique: single-shot Needle Needle type: Quincke (Attempted Pencan 24G. Unable to locate the space.)  Needle gauge: 22 G Needle length: 9 cm Assessment Sensory level: T8 Events: CSF return Additional Notes Functioning IV was confirmed and monitors were applied. Sterile prep and drape, including hand hygiene and sterile gloves were used. The patient was positioned and the spine was prepped. The skin was anesthetized with lidocaine.  Free flow of clear CSF was obtained prior to injecting local anesthetic into the CSF.  The spinal needle aspirated freely following injection.  The needle was carefully withdrawn.  The patient tolerated the procedure well.

## 2023-07-17 NOTE — Anesthesia Postprocedure Evaluation (Signed)
 Anesthesia Post Note  Patient: Christina Crawford  Procedure(s) Performed: ARTHROPLASTY, HIP, TOTAL, ANTERIOR APPROACH (Left: Hip)     Patient location during evaluation: PACU Anesthesia Type: Spinal Level of consciousness: oriented and awake and alert Pain management: pain level controlled Vital Signs Assessment: post-procedure vital signs reviewed and stable Respiratory status: spontaneous breathing and respiratory function stable Cardiovascular status: blood pressure returned to baseline and stable Postop Assessment: no headache, no backache, no apparent nausea or vomiting, spinal receding and patient able to bend at knees Anesthetic complications: no   No notable events documented.  Last Vitals:  Vitals:   07/17/23 1155 07/17/23 1215  BP:  134/66  Pulse: 76 66  Resp: 12   Temp:    SpO2: 100% 97%    Last Pain:  Vitals:   07/17/23 1215  TempSrc:   PainSc: 5                  Liborio Saccente,W. EDMOND

## 2023-07-17 NOTE — Interval H&P Note (Signed)
 History and Physical Interval Note:  07/17/2023 7:06 AM  Christina Crawford  has presented today for surgery, with the diagnosis of Left hip osteoarthritis.  The various methods of treatment have been discussed with the patient and family. After consideration of risks, benefits and other options for treatment, the patient has consented to  Procedure(s): ARTHROPLASTY, HIP, TOTAL, ANTERIOR APPROACH (Left) as a surgical intervention.  The patient's history has been reviewed, patient examined, no change in status, stable for surgery.  I have reviewed the patient's chart and labs.  Questions were answered to the patient's satisfaction.     Christina Crawford

## 2023-07-17 NOTE — Plan of Care (Signed)
  Problem: Activity: Goal: Risk for activity intolerance will decrease Outcome: Progressing   Problem: Safety: Goal: Ability to remain free from injury will improve Outcome: Progressing   Problem: Pain Managment: Goal: General experience of comfort will improve and/or be controlled Outcome: Progressing

## 2023-07-17 NOTE — Transfer of Care (Signed)
 Immediate Anesthesia Transfer of Care Note  Patient: Christina Crawford  Procedure(s) Performed: ARTHROPLASTY, HIP, TOTAL, ANTERIOR APPROACH (Left: Hip)  Patient Location: PACU  Anesthesia Type:Spinal and MAC combined with regional for post-op pain  Level of Consciousness: drowsy and patient cooperative  Airway & Oxygen Therapy: Patient Spontanous Breathing and Patient connected to face mask oxygen  Post-op Assessment: Report given to RN and Post -op Vital signs reviewed and stable  Post vital signs: Reviewed and stable  Last Vitals:  Vitals Value Taken Time  BP 122/59 07/17/23 10:02  Temp    Pulse 69 07/17/23 10:04  Resp    SpO2 100 % 07/17/23 10:04  Vitals shown include unfiled device data.  Last Pain:  Vitals:   07/17/23 0555  TempSrc: Oral         Complications: No notable events documented.

## 2023-07-17 NOTE — H&P (Signed)
 TOTAL HIP ADMISSION H&P  Patient is admitted for left total hip arthroplasty.  Therapy Plans: HEP Disposition: Home with husband (sister checking on her) Planned DVT Prophylaxis: aspirin  81mg  BID DME needed: walker PCP: Dr. Tanda - clearance received Cardio: Dr. Ethan - clearance received Heme/onc - Dr. Karie --- had appointment on Monday (hx of breast cancer, 2011) TXA: IV Allergies: amoxicillin - rash, augmentin, cefixime - rash, doxycycline, sulfa - GI issues Anesthesia Concerns: No fentanyl  , does not want spinal -- GENERAL ANESTHESIA -- BMI: 28.6 Last HgbA1c: Not diabetic   Other: - Planning on SDD - hx of breast cancer - s/p mastectomy in 2011 - NO STICKS IN HER LEFT ARM / BP / ETC - tells me she thinks she has taken keflex without issues - No hx of VTE - *patient requests hydrocodone , tylenol , robaxin , phenergan   Subjective:  Chief Complaint: left hip pain  HPI: Christina Crawford, 73 y.o. female, has a history of pain and functional disability in the left hip(s) due to arthritis and patient has failed non-surgical conservative treatments for greater than 12 weeks to include NSAID's and/or analgesics, corticosteriod injections, and activity modification.  Onset of symptoms was gradual starting 2 years ago with gradually worsening course since that time.The patient noted no past surgery on the left hip(s).  Patient currently rates pain in the left hip at 8 out of 10 with activity. Patient has worsening of pain with activity and weight bearing, pain that interfers with activities of daily living, and pain with passive range of motion. Patient has evidence of joint space narrowing by imaging studies. This condition presents safety issues increasing the risk of falls.   There is no current active infection.  Patient Active Problem List   Diagnosis Date Noted   S/P left TKA 05/26/2014   S/P knee replacement 05/26/2014   Obese 11/12/2013   Past Medical History:  Diagnosis  Date   Anxiety    Arthritis    Cancer (HCC)    left breast cancer - 2011   Contact dermatitis    Difficulty sleeping    Family history of anesthesia complication    sister slow to wake up with general anesthesia    GERD (gastroesophageal reflux disease)    Headache    not often    History of transfusion 01/10/1979   Hyperlipidemia    Hypertension    Hypothyroidism    Lymphedema    left arm    PONV (postoperative nausea and vomiting)     Past Surgical History:  Procedure Laterality Date   ABDOMINAL HYSTERECTOMY     APPENDECTOMY     BILATERAL TOTAL MASTECTOMY WITH AXILLARY LYMPH NODE DISSECTION     CATARACT EXTRACTION, BILATERAL Bilateral    EYE SURGERY Left    nives   ganglion tumors removed from hands  Bilateral    right knee arthroscopy      TONSILLECTOMY     TOTAL KNEE ARTHROPLASTY Right 11/11/2013   Procedure: RIGHT TOTAL KNEE ARTHROPLASTY;  Surgeon: Donnice JONETTA Car, MD;  Location: [facility];  Service: Orthopedics;  Laterality: Right;   TOTAL KNEE ARTHROPLASTY Left 05/26/2014   Procedure: LEFT TOTAL KNEE ARTHROPLASTY;  Surgeon: Donnice Car, MD;  Location: [facility];  Service: Orthopedics;  Laterality: Left;   TUBAL LIGATION      Current Facility-Administered Medications  Medication Dose Route Frequency Provider Last Rate Last Admin   ceFAZolin  (ANCEF ) IVPB 2g/100 mL premix  2 g Intravenous On Call to OR Patti Rosina SAUNDERS,  PA-C       chlorhexidine  (PERIDEX ) 0.12 % solution 15 mL  15 mL Mouth/Throat Once Maryclare Cornet, MD       Or   Oral care mouth rinse  15 mL Mouth Rinse Once Hodierne, Adam, MD       dexamethasone  (DECADRON ) injection 8 mg  8 mg Intravenous Once Patti Rosina SAUNDERS, PA-C       lactated ringers  infusion   Intravenous Continuous Maryclare Cornet, MD       povidone-iodine  10 % swab 2 Application  2 Application Topical Once Patti Rosina SAUNDERS, PA-C       tranexamic acid  (CYKLOKAPRON ) IVPB 1,000 mg  1,000 mg Intravenous To OR Patti Rosina SAUNDERS, PA-C        Allergies  Allergen Reactions   Sulfamethoxazole Nausea Only   Adhesive [Tape] Rash   Augmentin Es-600 [Amoxicillin-Pot Clavulanate] Rash   Contrast Media [Iodinated Contrast Media] Rash   Latex Rash   Suprax [Cefixime] Rash    Social History   Tobacco Use   Smoking status: Never   Smokeless tobacco: Never  Substance Use Topics   Alcohol use: No    No family history on file.   Review of Systems  Constitutional:  Negative for chills and fever.  Respiratory:  Negative for cough and shortness of breath.   Cardiovascular:  Negative for chest pain.  Gastrointestinal:  Negative for nausea and vomiting.  Musculoskeletal:  Positive for arthralgias.     Objective:  Physical Exam Well nourished and well developed. General: Alert and oriented x3, cooperative and pleasant, no acute distress.  Musculoskeletal: Left hip exam: She does have reproducible pain with hip flexion internal rotation to 10 degrees, external rotation over 20 degrees No significant external rotation contracture with active hip flexion She is neuro vas intact distally  Vital signs in last 24 hours: Temp:  [98.4 F (36.9 C)] 98.4 F (36.9 C) (07/08 0555) Pulse Rate:  [63] 63 (07/08 0555) Resp:  [17] 17 (07/08 0555) BP: (170)/(78) 170/78 (07/08 0555) SpO2:  [99 %] 99 % (07/08 0555)  Labs:   Estimated body mass index is 27.46 kg/m as calculated from the following:   Height as of 07/04/23: 5' 4 (1.626 m).   Weight as of 07/04/23: 72.6 kg.   Imaging Review Plain radiographs demonstrate severe degenerative joint disease of the left hip(s). The bone quality appears to be adequate for age and reported activity level.      Assessment/Plan:  End stage arthritis, left hip(s)  The patient history, physical examination, clinical judgement of the provider and imaging studies are consistent with end stage degenerative joint disease of the left hip(s) and total hip arthroplasty is deemed medically  necessary. The treatment options including medical management, injection therapy, arthroscopy and arthroplasty were discussed at length. The risks and benefits of total hip arthroplasty were presented and reviewed. The risks due to aseptic loosening, infection, stiffness, dislocation/subluxation,  thromboembolic complications and other imponderables were discussed.  The patient acknowledged the explanation, agreed to proceed with the plan and consent was signed. Patient is being admitted for inpatient treatment for surgery, pain control, PT, OT, prophylactic antibiotics, VTE prophylaxis, progressive ambulation and ADL's and discharge planning.The patient is planning to be discharged home.   Rosina Patti, PA-C Orthopedic Surgery [facility] [place] [phone]

## 2023-07-18 ENCOUNTER — Other Ambulatory Visit (HOSPITAL_COMMUNITY): Payer: Self-pay

## 2023-07-18 ENCOUNTER — Encounter (HOSPITAL_COMMUNITY): Payer: Self-pay | Admitting: Orthopedic Surgery

## 2023-07-18 DIAGNOSIS — M1612 Unilateral primary osteoarthritis, left hip: Secondary | ICD-10-CM | POA: Diagnosis not present

## 2023-07-18 LAB — BASIC METABOLIC PANEL WITH GFR
Anion gap: 12 (ref 5–15)
BUN: 19 mg/dL (ref 8–23)
CO2: 27 mmol/L (ref 22–32)
Calcium: 8.7 mg/dL — ABNORMAL LOW (ref 8.9–10.3)
Chloride: 99 mmol/L (ref 98–111)
Creatinine, Ser: 0.81 mg/dL (ref 0.44–1.00)
GFR, Estimated: >60 mL/min (ref >60)
Glucose, Bld: 142 mg/dL — ABNORMAL HIGH (ref 70–99)
Potassium: 3.4 mmol/L — ABNORMAL LOW (ref 3.5–5.1)
Sodium: 138 mmol/L (ref 135–145)

## 2023-07-18 LAB — CBC
HCT: 33.8 % — ABNORMAL LOW (ref 36.0–46.0)
Hemoglobin: 11.2 g/dL — ABNORMAL LOW (ref 12.0–15.0)
MCH: 29.6 pg (ref 26.0–34.0)
MCHC: 33.1 g/dL (ref 30.0–36.0)
MCV: 89.4 fL (ref 80.0–100.0)
Platelets: 200 K/uL (ref 150–400)
RBC: 3.78 MIL/uL — ABNORMAL LOW (ref 3.87–5.11)
RDW: 12.7 % (ref 11.5–15.5)
WBC: 12.6 K/uL — ABNORMAL HIGH (ref 4.0–10.5)
nRBC: 0 % (ref 0.0–0.2)

## 2023-07-18 MED ORDER — SENNA 8.6 MG PO TABS
2.0000 | ORAL_TABLET | Freq: Every day | ORAL | 0 refills | Status: AC
  Filled 2023-07-18: qty 28, 14d supply, fill #0

## 2023-07-18 MED ORDER — ASPIRIN 81 MG PO CHEW
81.0000 mg | CHEWABLE_TABLET | Freq: Two times a day (BID) | ORAL | 0 refills | Status: AC
  Filled 2023-07-18: qty 56, 28d supply, fill #0

## 2023-07-18 MED ORDER — POLYETHYLENE GLYCOL 3350 17 GM/SCOOP PO POWD
17.0000 g | Freq: Two times a day (BID) | ORAL | 0 refills | Status: AC
  Filled 2023-07-18: qty 238, 7d supply, fill #0

## 2023-07-18 MED ORDER — POTASSIUM CHLORIDE CRYS ER 20 MEQ PO TBCR
20.0000 meq | EXTENDED_RELEASE_TABLET | Freq: Once | ORAL | Status: AC
  Administered 2023-07-18: 20 meq via ORAL
  Filled 2023-07-18: qty 1

## 2023-07-18 MED ORDER — HYDROCODONE-ACETAMINOPHEN 5-325 MG PO TABS
1.0000 | ORAL_TABLET | ORAL | 0 refills | Status: AC | PRN
Start: 2023-07-18 — End: ?
  Filled 2023-07-18: qty 42, 7d supply, fill #0

## 2023-07-18 MED ORDER — METHOCARBAMOL 500 MG PO TABS
500.0000 mg | ORAL_TABLET | Freq: Four times a day (QID) | ORAL | 0 refills | Status: AC | PRN
  Filled 2023-07-18: qty 40, 10d supply, fill #0

## 2023-07-18 NOTE — Progress Notes (Signed)
   Subjective: 1 Day Post-Op Procedure(s) (LRB): ARTHROPLASTY, HIP, TOTAL, ANTERIOR APPROACH (Left) Patient reports pain as mild.   Patient seen in rounds with Dr. Ernie. Patient is resting in bed on exam this morning. No acute events overnight. Foley catheter removed. Patient was limited with PT yesterday due to low BP and nausea. She does not feel nauseous today. We will start therapy today.   Objective: Vital signs in last 24 hours: Temp:  [97.4 F (36.3 C)-97.8 F (36.6 C)] 97.7 F (36.5 C) (07/09 0530) Pulse Rate:  [58-76] 72 (07/09 0530) Resp:  [10-23] 18 (07/09 0530) BP: (97-135)/(53-80) 130/64 (07/09 0530) SpO2:  [93 %-100 %] 100 % (07/09 0530)  Intake/Output from previous day:  Intake/Output Summary (Last 24 hours) at 07/18/2023 0749 Last data filed at 07/18/2023 0436 Gross per 24 hour  Intake 2217 ml  Output 1600 ml  Net 617 ml     Intake/Output this shift: No intake/output data recorded.  Labs: Recent Labs    07/18/23 0319  HGB 11.2*   Recent Labs    07/18/23 0319  WBC 12.6*  RBC 3.78*  HCT 33.8*  PLT 200   Recent Labs    07/18/23 0319  NA 138  K 3.4*  CL 99  CO2 27  BUN 19  CREATININE 0.81  GLUCOSE 142*  CALCIUM  8.7*   No results for input(s): LABPT, INR in the last 72 hours.  Exam: General - Patient is Alert and Oriented Extremity - Neurologically intact Sensation intact distally Intact pulses distally Dorsiflexion/Plantar flexion intact Dressing - dressing C/D/I Motor Function - intact, moving foot and toes well on exam.   Past Medical History:  Diagnosis Date   Anxiety    Arthritis    Cancer (HCC)    left breast cancer - 2011   Contact dermatitis    Difficulty sleeping    Family history of anesthesia complication    sister slow to wake up with general anesthesia    GERD (gastroesophageal reflux disease)    Headache    not often    History of transfusion 01/10/1979   Hyperlipidemia    Hypertension    Hypothyroidism     Lymphedema    left arm    PONV (postoperative nausea and vomiting)     Assessment/Plan: 1 Day Post-Op Procedure(s) (LRB): ARTHROPLASTY, HIP, TOTAL, ANTERIOR APPROACH (Left) Principal Problem:   S/P total hip arthroplasty Active Problems:   S/P total left hip arthroplasty  Estimated body mass index is 27.46 kg/m as calculated from the following:   Height as of this encounter: 5' 4 (1.626 m).   Weight as of this encounter: 72.6 kg. Advance diet Up with therapy D/C IV fluids  DVT Prophylaxis - Aspirin  Weight bearing as tolerated.  Hgb stable at 11.2 this AM. K3.4 - will give 1 dose kdur  Plan is to go Home after hospital stay. Plan for discharge today after meeting goals with therapy. Follow up in the office in 2 weeks.   Rosina Calin, PA-C Orthopedic Surgery [phone] 07/18/2023, 7:49 AM

## 2023-07-18 NOTE — Progress Notes (Signed)
 Discharge medication delivered to patient at bedside [facility]

## 2023-07-18 NOTE — Progress Notes (Addendum)
 Physical Therapy Treatment Patient Details Name: Christina Crawford MRN: [id number] DOB: 1950/11/11 Today's Date: 07/18/2023   History of Present Illness Pt is 73 yo female s/p L anterior THA on 07/17/23.  Pt with hx including but not limited to  breast CA, mastectomy, bil TKA, arthritis, HLD, HTN, lymphedema L arm    PT Comments  Pt is POD # 1 and is progressing well.  She feels much better - no nausea or lightheadedness and BP stable.  She ambulated 200' and performed stairs similar to home set up.  Pt with good pain control, understanding of safety and HEP.  Pt demonstrates safe gait & transfers in order to return home from PT perspective once discharged by MD.  While in hospital, will continue to benefit from PT for skilled therapy to advance mobility and exercises.       If plan is discharge home, recommend the following: A little help with walking and/or transfers;A little help with bathing/dressing/bathroom   Can travel by private vehicle        Equipment Recommendations  None recommended by PT    Recommendations for Other Services       Precautions / Restrictions Precautions Precautions: Fall Restrictions LLE Weight Bearing Per Provider Order: Weight bearing as tolerated     Mobility  Bed Mobility Overal bed mobility: Independent Bed Mobility: Supine to Sit     Supine to sit: Supervision          Transfers Overall transfer level: Needs assistance Equipment used: Rolling walker (2 wheels) Transfers: Sit to/from Stand Sit to Stand: Contact guard assist, Supervision           General transfer comment: Perforemd x 3; stated CGA progressed supervision    Ambulation/Gait Ambulation/Gait assistance: Contact guard assist, Supervision Gait Distance (Feet): 200 Feet Assistive device: Rolling walker (2 wheels) Gait Pattern/deviations: Step-through pattern Gait velocity: decreased but functional     General Gait Details: Started CGA progressed supervision; near  normal gait; good RW proximity   Stairs Stairs: Yes Stairs assistance: Contact guard assist Stair Management: One rail Left, Step to pattern, Forwards Number of Stairs: 6 General stair comments: started bil rails progressed to rail L with both hands; tolerated well; educated on sequencing   Wheelchair Mobility     Tilt Bed    Modified Rankin (Stroke Patients Only)       Balance Overall balance assessment: Needs assistance Sitting-balance support: No upper extremity supported Sitting balance-Leahy Scale: Good     Standing balance support: Bilateral upper extremity supported, No upper extremity supported Standing balance-Leahy Scale: Fair Standing balance comment: RW to ambulate; could stand wtihout UE support                            Communication    Cognition Arousal: Alert Behavior During Therapy: WFL for tasks assessed/performed   PT - Cognitive impairments: No apparent impairments                       PT - Cognition Comments: Pleasant, alert, responding quickly - reports feels much better than yesterday        Cueing    Exercises Total Joint Exercises Ankle Circles/Pumps: AROM, Both, 10 reps, Supine Quad Sets: AROM, Both, 10 reps, Supine Heel Slides: AAROM, Left, 10 reps, Supine Hip ABduction/ADduction: AAROM, Left, 10 reps, Supine Long Arc Quad: AROM, Left, 10 reps, Seated Other Exercises Other Exercises: Educated on [place] with  gait belt Other Exercises: Standing, L , AROM, wtih counter and supervision 5 reps: hip ext, hip abd, h-s curl, marching    General Comments   Educated on safe ice use, no pivots, car transfers, and TED hose during day. Also, encouraged walking every 1-2 hours during day for 5-10 mins. Educated on HEP with focus on mobility the first week. Discussed doing exercises within pain control and if pain increasing could decreased ROM, reps, and stop exercises as needed. Encouraged to perform ankle pumps frequently  for blood flow.      Pertinent Vitals/Pain Pain Assessment Pain Assessment: 0-10 Pain Score: 2  Pain Location: L hip Pain Descriptors / Indicators: Discomfort Pain Intervention(s): Limited activity within patient's tolerance, Monitored during session, Premedicated before session, Repositioned    Home Living                          Prior Function            PT Goals (current goals can now be found in the care plan section) Progress towards PT goals: Progressing toward goals    Frequency    7X/week      PT Plan      Co-evaluation              AM-PAC PT 6 Clicks Mobility   Outcome Measure  Help needed turning from your back to your side while in a flat bed without using bedrails?: A Little Help needed moving from lying on your back to sitting on the side of a flat bed without using bedrails?: A Little Help needed moving to and from a bed to a chair (including a wheelchair)?: A Little Help needed standing up from a chair using your arms (e.g., wheelchair or bedside chair)?: A Little Help needed to walk in hospital room?: A Little Help needed climbing 3-5 steps with a railing? : A Little 6 Click Score: 18    End of Session Equipment Utilized During Treatment: Gait belt Activity Tolerance: Patient tolerated treatment well Patient left: with call bell/phone within reach;in chair Nurse Communication: Mobility status PT Visit Diagnosis: Other abnormalities of gait and mobility (R26.89);Muscle weakness (generalized) (M62.81)     Time: 8992-8967 PT Time Calculation (min) (ACUTE ONLY): 25 min  Charges:    $Gait Training: 8-22 mins $Therapeutic Exercise: 8-22 mins PT General Charges $$ ACUTE PT VISIT: 1 Visit                     Benjiman, PT Acute Rehab [facility] [phone]    Benjiman VEAR Mulberry 07/18/2023, 12:27 PM

## 2023-07-18 NOTE — TOC Transition Note (Signed)
 Transition of Care [facility]) - Discharge Note   Patient Details  Name: Christina Crawford MRN: [id number] Date of Birth: Mar 23, 1950  [facility] CM/SW Contact:  NORMAN ASPEN, LCSW Phone Number: 07/18/2023, 9:35 AM   Clinical Narrative:     Met with pt and confirming she does need RW for home.  No DME agency preference - order placed with [employer] and RW delivered to room.  Plan for HEP.  No further TOC needs.  Final next level of care: Home/Self Care Barriers to Discharge: No Barriers Identified   Patient Goals and CMS Choice Patient states their goals for this hospitalization and ongoing recovery are:: return home          Discharge Placement                       Discharge Plan and Services Additional resources added to the After Visit Summary for                  DME Arranged: Walker rolling DME Agency: [employer] Date DME Agency Contacted: 07/18/23 Time DME Agency Contacted: 0930 Representative spoke with at DME Agency: Kaitlyn            Social Drivers of Health (SDOH) Interventions SDOH Screenings   Food Insecurity: No Food Insecurity (07/17/2023)  Housing: Low Risk  (07/17/2023)  Transportation Needs: No Transportation Needs (07/17/2023)  Utilities: Not At Risk (07/17/2023)  Social Connections: Socially Integrated (07/17/2023)  Tobacco Use: Low Risk  (07/17/2023)     Readmission Risk Interventions     No data to display

## 2023-07-21 LAB — TYPE AND SCREEN
ABO/RH(D): A POS
Antibody Screen: NEGATIVE
Unit division: 0
Unit division: 0

## 2023-07-21 LAB — BPAM RBC
Blood Product Expiration Date: 202507232359
Blood Product Expiration Date: 202507232359
Unit Type and Rh: 6200
Unit Type and Rh: 6200

## 2023-07-26 NOTE — Discharge Summary (Signed)
 Patient ID: Christina Crawford MRN: [id number] DOB/AGE: 1950-01-14 73 y.o.  Admit date: 07/17/2023 Discharge date: 07/18/2023  Admission Diagnoses:  Left hip osteoarthritis  Discharge Diagnoses:  Principal Problem:   S/P total hip arthroplasty Active Problems:   S/P total left hip arthroplasty   Past Medical History:  Diagnosis Date   Anxiety    Arthritis    Cancer (HCC)    left breast cancer - 2011   Contact dermatitis    Difficulty sleeping    Family history of anesthesia complication    sister slow to wake up with general anesthesia    GERD (gastroesophageal reflux disease)    Headache    not often    History of transfusion 01/10/1979   Hyperlipidemia    Hypertension    Hypothyroidism    Lymphedema    left arm    PONV (postoperative nausea and vomiting)     Surgeries: Procedure(s): ARTHROPLASTY, HIP, TOTAL, ANTERIOR APPROACH on 07/17/2023   Consultants:   Discharged Condition: Improved  Hospital Course: Christina Crawford is an 73 y.o. female who was admitted 07/17/2023 for operative treatment ofS/P total hip arthroplasty. Patient has severe unremitting pain that affects sleep, daily activities, and work/hobbies. After pre-op clearance the patient was taken to the operating room on 07/17/2023 and underwent  Procedure(s): ARTHROPLASTY, HIP, TOTAL, ANTERIOR APPROACH.    Patient was given perioperative antibiotics:  Anti-infectives (From admission, onward)    Start     Dose/Rate Route Frequency Ordered Stop   07/17/23 1500  ceFAZolin  (ANCEF ) IVPB 2g/100 mL premix        2 g 200 mL/hr over 30 Minutes Intravenous Every 6 hours 07/17/23 1213 07/18/23 0915   07/17/23 0600  ceFAZolin  (ANCEF ) IVPB 2g/100 mL premix        2 g 200 mL/hr over 30 Minutes Intravenous On call to O.R. 07/17/23 [id number] 07/17/23 0844        Patient was given sequential compression devices, early ambulation, and chemoprophylaxis to prevent DVT. Patient worked with PT and was meeting their goals regarding  safe ambulation and transfers.  Patient benefited maximally from hospital stay and there were no complications.    Recent vital signs: No data found.   Recent laboratory studies: No results for input(s): WBC, HGB, HCT, PLT, NA, K, CL, CO2, BUN, CREATININE, GLUCOSE, INR, CALCIUM  in the last 72 hours.  Invalid input(s): PT, 2   Discharge Medications:   Allergies as of 07/18/2023       Reactions   Sulfamethoxazole Nausea Only   Adhesive [tape] Rash   Augmentin Es-600 [amoxicillin-pot Clavulanate] Rash   Contrast Media [iodinated Contrast Media] Rash   Latex Rash   Suprax [cefixime] Rash        Medication List     TAKE these medications    albuterol  108 (90 Base) MCG/ACT inhaler Commonly known as: VENTOLIN  HFA Inhale 2 puffs into the lungs every 4 (four) hours as needed for wheezing or shortness of breath.   amLODipine  5 MG tablet Commonly known as: NORVASC  Take 5 mg by mouth daily.   Aspirin  Low Dose 81 MG chewable tablet Generic drug: aspirin  Chew 1 tablet (81 mg total) by mouth 2 (two) times daily for 28 days.   Azelastine HCl 137 MCG/SPRAY Soln Place 1 spray into the nose daily.   CALCIUM  + VITAMIN D3 PO Take 2 each by mouth daily.   CoQ10 100 MG Caps Take 100 mg by mouth daily.   diphenhydrAMINE  25 MG tablet Commonly known  as: BENADRYL  Take 25 mg by mouth daily as needed (bug bites).   folic acid  1 MG tablet Commonly known as: FOLVITE  Take 1 mg by mouth daily.   hydrochlorothiazide  25 MG tablet Commonly known as: HYDRODIURIL  Take 25 mg by mouth daily.   HYDROcodone -acetaminophen  5-325 MG tablet Commonly known as: NORCO/VICODIN Take 1 tablet by mouth every 4 (four) hours as needed for severe pain (pain score 7-10).   levothyroxine  75 MCG tablet Commonly known as: SYNTHROID  Take 75 mcg by mouth daily before breakfast.   methocarbamol  500 MG tablet Commonly known as: ROBAXIN  Take 1 tablet (500 mg total) by mouth every  6 (six) hours as needed for muscle spasms.   metoprolol  succinate 25 MG 24 hr tablet Commonly known as: TOPROL -XL Take 25 mg by mouth daily.   montelukast  10 MG tablet Commonly known as: SINGULAIR  Take 10 mg by mouth every evening.   multivitamin with minerals tablet Take 2 tablets by mouth daily.   omeprazole 40 MG capsule Commonly known as: PRILOSEC Take 40 mg by mouth every morning.   polyethylene glycol powder 17 GM/SCOOP powder Commonly known as: GLYCOLAX /MIRALAX  Take 17 g by mouth 2 (two) times daily.   promethazine  25 MG tablet Commonly known as: PHENERGAN  Take 25 mg by mouth every 6 (six) hours as needed for nausea or vomiting.   rosuvastatin  10 MG tablet Commonly known as: CRESTOR  Take 10 mg by mouth daily.   senna 8.6 MG Tabs tablet Commonly known as: SENOKOT Take 2 tablets (17.2 mg total) by mouth at bedtime for 14 days.               Discharge Care Instructions  (From admission, onward)           Start     Ordered   07/18/23 0000  Change dressing       Comments: Maintain surgical dressing until follow up in the clinic. If the edges start to pull up, may reinforce with tape. If the dressing is no longer working, may remove and cover with gauze and tape, but must keep the area dry and clean.  Call with any questions or concerns.   07/18/23 0752            Diagnostic Studies: DG Pelvis Portable Result Date: 07/17/2023 CLINICAL DATA:  Status post left total hip arthroplasty. EXAM: PORTABLE PELVIS 1-2 VIEWS COMPARISON:  None Available. FINDINGS: Status post left total hip arthroplasty with normal alignment. No acute fracture or dislocation. Postoperative soft tissue swelling and soft tissue air along the left hip. IMPRESSION: Status post left total hip arthroplasty. No evidence of acute complication. Electronically Signed   By: Harrietta Sherry M.D.   On: 07/17/2023 10:53   DG HIP UNILAT WITH PELVIS 1V LEFT Result Date: 07/17/2023 CLINICAL DATA:   Intraoperative fluoroscopy of left total hip arthroplasty. EXAM: DG HIP (WITH OR WITHOUT PELVIS) 1V*L* COMPARISON:  None Available. FINDINGS: Multiple intraoperative fluoroscopic spot images are provided. Interval left total hip arthroplasty. Normal alignment. Total fluoroscopy time: 13 seconds Total dose: Radiation Exposure Index (as provided by the fluoroscopic device): 1.9 mGy air Kerma Please see intraoperative findings for further detail. IMPRESSION: Intraoperative fluoroscopy of interval left total hip arthroplasty. Electronically Signed   By: Harrietta Sherry M.D.   On: 07/17/2023 10:02   DG C-Arm 1-60 Min-No Report Result Date: 07/17/2023 Fluoroscopy was utilized by the requesting physician.  No radiographic interpretation.   DG C-Arm 1-60 Min-No Report Result Date: 07/17/2023 Fluoroscopy was utilized by the  requesting physician.  No radiographic interpretation.    Disposition: Discharge disposition: 01-Home or Self Care       Discharge Instructions     Call MD / Call 911   Complete by: As directed    If you experience chest pain or shortness of breath, CALL 911 and be transported to the hospital emergency room.  If you develope a fever above 101 F, pus (white drainage) or increased drainage or redness at the wound, or calf pain, call your surgeon's office.   Change dressing   Complete by: As directed    Maintain surgical dressing until follow up in the clinic. If the edges start to pull up, may reinforce with tape. If the dressing is no longer working, may remove and cover with gauze and tape, but must keep the area dry and clean.  Call with any questions or concerns.   Constipation Prevention   Complete by: As directed    Drink plenty of fluids.  Prune juice may be helpful.  You may use a stool softener, such as Colace (over the counter) 100 mg twice a day.  Use MiraLax  (over the counter) for constipation as needed.   Diet - low sodium heart healthy   Complete by: As directed     Increase activity slowly as tolerated   Complete by: As directed    Weight bearing as tolerated with assist device (walker, cane, etc) as directed, use it as long as suggested by your surgeon or therapist, typically at least 4-6 weeks.   Post-operative opioid taper instructions:   Complete by: As directed    POST-OPERATIVE OPIOID TAPER INSTRUCTIONS: It is important to wean off of your opioid medication as soon as possible. If you do not need pain medication after your surgery it is ok to stop day one. Opioids include: Codeine, Hydrocodone (Norco, Vicodin), Oxycodone (Percocet, oxycontin ) and hydromorphone  amongst others.  Long term and even short term use of opiods can cause: Increased pain response Dependence Constipation Depression Respiratory depression And more.  Withdrawal symptoms can include Flu like symptoms Nausea, vomiting And more Techniques to manage these symptoms Hydrate well Eat regular healthy meals Stay active Use relaxation techniques(deep breathing, meditating, yoga) Do Not substitute Alcohol to help with tapering If you have been on opioids for less than two weeks and do not have pain than it is ok to stop all together.  Plan to wean off of opioids This plan should start within one week post op of your joint replacement. Maintain the same interval or time between taking each dose and first decrease the dose.  Cut the total daily intake of opioids by one tablet each day Next start to increase the time between doses. The last dose that should be eliminated is the evening dose.      TED hose   Complete by: As directed    Use stockings (TED hose) for 2 weeks on both leg(s).  You may remove them at night for sleeping.        Follow-up Information     Ernie Cough, MD. Schedule an appointment as soon as possible for a visit in 2 week(s).   Specialty: Orthopedic Surgery Contact information: [address] [place] [address] [place]  [address] [phone]                  Signed: Rosina JONELLE Calin 07/26/2023, 7:15 AM
